# Patient Record
Sex: Male | Born: 1935 | Race: White | Hispanic: No | Marital: Married | Smoking: Never smoker
Health system: Southern US, Community
[De-identification: ages and names within clinical notes are randomized; demographics above are authoritative.]

## PROBLEM LIST (undated history)

## (undated) DIAGNOSIS — I480 Paroxysmal atrial fibrillation: Secondary | ICD-10-CM

## (undated) DIAGNOSIS — I219 Acute myocardial infarction, unspecified: Secondary | ICD-10-CM

## (undated) HISTORY — PX: OTHER SURGICAL HISTORY: SHX169

---
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
## 2006-09-08 ENCOUNTER — Ambulatory Visit: Payer: Self-pay | Admitting: Thoracic Surgery (Cardiothoracic Vascular Surgery)

## 2006-10-25 ENCOUNTER — Ambulatory Visit: Payer: Self-pay

## 2009-07-25 ENCOUNTER — Encounter: Payer: Self-pay | Admitting: Cardiovascular Disease

## 2010-04-28 NOTE — Letter (Signed)
Summary: Letter from [place] from [place] By: Zenovia Jarred 08/20/2009 11:20:56  _____________________________________________________________________  External Attachment:    Type:   Image     Comment:   External Document

## 2010-05-16 ENCOUNTER — Inpatient Hospital Stay (HOSPITAL_COMMUNITY): Payer: Medicare Other

## 2010-05-16 ENCOUNTER — Inpatient Hospital Stay (HOSPITAL_COMMUNITY)
Admission: EM | Admit: 2010-05-16 | Discharge: 2010-05-24 | DRG: 871 | Disposition: A | Discharge status: Home Health | Payer: Medicare Other | Source: Other Acute Inpatient Hospital | Attending: Internal Medicine | Admitting: Internal Medicine

## 2010-05-16 DIAGNOSIS — D72829 Elevated white blood cell count, unspecified: Secondary | ICD-10-CM | POA: Diagnosis present

## 2010-05-16 DIAGNOSIS — I129 Hypertensive chronic kidney disease with stage 1 through stage 4 chronic kidney disease, or unspecified chronic kidney disease: Secondary | ICD-10-CM | POA: Diagnosis present

## 2010-05-16 DIAGNOSIS — R6520 Severe sepsis without septic shock: Secondary | ICD-10-CM | POA: Diagnosis present

## 2010-05-16 DIAGNOSIS — R6521 Severe sepsis with septic shock: Secondary | ICD-10-CM | POA: Diagnosis present

## 2010-05-16 DIAGNOSIS — E872 Acidosis, unspecified: Secondary | ICD-10-CM | POA: Diagnosis present

## 2010-05-16 DIAGNOSIS — R5381 Other malaise: Secondary | ICD-10-CM | POA: Diagnosis present

## 2010-05-16 DIAGNOSIS — G9341 Metabolic encephalopathy: Secondary | ICD-10-CM | POA: Diagnosis present

## 2010-05-16 DIAGNOSIS — N179 Acute kidney failure, unspecified: Secondary | ICD-10-CM | POA: Diagnosis present

## 2010-05-16 DIAGNOSIS — R197 Diarrhea, unspecified: Secondary | ICD-10-CM | POA: Diagnosis present

## 2010-05-16 DIAGNOSIS — A419 Sepsis, unspecified organism: Secondary | ICD-10-CM | POA: Diagnosis present

## 2010-05-16 DIAGNOSIS — J9600 Acute respiratory failure, unspecified whether with hypoxia or hypercapnia: Secondary | ICD-10-CM | POA: Diagnosis present

## 2010-05-16 DIAGNOSIS — E875 Hyperkalemia: Secondary | ICD-10-CM | POA: Diagnosis present

## 2010-05-16 DIAGNOSIS — I4891 Unspecified atrial fibrillation: Secondary | ICD-10-CM | POA: Diagnosis present

## 2010-05-16 DIAGNOSIS — Z781 Physical restraint status: Secondary | ICD-10-CM | POA: Diagnosis present

## 2010-05-16 DIAGNOSIS — I2510 Atherosclerotic heart disease of native coronary artery without angina pectoris: Secondary | ICD-10-CM | POA: Diagnosis present

## 2010-05-16 DIAGNOSIS — D649 Anemia, unspecified: Secondary | ICD-10-CM | POA: Diagnosis present

## 2010-05-16 DIAGNOSIS — J189 Pneumonia, unspecified organism: Secondary | ICD-10-CM | POA: Diagnosis present

## 2010-05-16 DIAGNOSIS — I498 Other specified cardiac arrhythmias: Secondary | ICD-10-CM | POA: Diagnosis present

## 2010-05-16 DIAGNOSIS — Z8673 Personal history of transient ischemic attack (TIA), and cerebral infarction without residual deficits: Secondary | ICD-10-CM

## 2010-05-16 DIAGNOSIS — N189 Chronic kidney disease, unspecified: Secondary | ICD-10-CM | POA: Diagnosis present

## 2010-05-16 DIAGNOSIS — K047 Periapical abscess without sinus: Secondary | ICD-10-CM | POA: Diagnosis present

## 2010-05-16 DIAGNOSIS — Z951 Presence of aortocoronary bypass graft: Secondary | ICD-10-CM

## 2010-05-16 DIAGNOSIS — J9819 Other pulmonary collapse: Secondary | ICD-10-CM | POA: Diagnosis present

## 2010-05-16 LAB — GLUCOSE, CAPILLARY: Glucose-Capillary: 138 mg/dL — ABNORMAL HIGH (ref 70–99)

## 2010-05-16 LAB — PROTIME-INR: Prothrombin Time: 17 seconds — ABNORMAL HIGH (ref 11.6–15.2)

## 2010-05-16 LAB — COMPREHENSIVE METABOLIC PANEL
ALT: 14 U/L (ref 0–53)
AST: 16 U/L (ref 0–37)
Albumin: 2.3 g/dL — ABNORMAL LOW (ref 3.5–5.2)
Alkaline Phosphatase: 47 U/L (ref 39–117)
CO2: 13 mEq/L — ABNORMAL LOW (ref 19–32)
Chloride: 117 mEq/L — ABNORMAL HIGH (ref 96–112)
Creatinine, Ser: 5.79 mg/dL — ABNORMAL HIGH (ref 0.4–1.5)
GFR calc Af Amer: 12 mL/min — ABNORMAL LOW (ref >60)
GFR calc non Af Amer: 10 mL/min — ABNORMAL LOW (ref >60)
Potassium: 5.8 mEq/L — ABNORMAL HIGH (ref 3.5–5.1)
Sodium: 140 mEq/L (ref 135–145)
Total Bilirubin: 0.3 mg/dL (ref 0.3–1.2)

## 2010-05-16 LAB — URINE MICROSCOPIC-ADD ON

## 2010-05-16 LAB — CBC
MCH: 30.4 pg (ref 26.0–34.0)
MCHC: 31.5 g/dL (ref 30.0–36.0)
MCV: 96.6 fL (ref 78.0–100.0)
Platelets: 269 10*3/uL (ref 150–400)
RBC: 3.49 MIL/uL — ABNORMAL LOW (ref 4.22–5.81)

## 2010-05-16 LAB — CARBOXYHEMOGLOBIN
Methemoglobin: 0.7 % (ref 0.0–1.5)
O2 Saturation: 63 %
Total hemoglobin: 11.3 g/dL — ABNORMAL LOW (ref 13.5–18.0)

## 2010-05-16 LAB — URINALYSIS, ROUTINE W REFLEX MICROSCOPIC
Ketones, ur: NEGATIVE mg/dL
Leukocytes, UA: NEGATIVE
Nitrite: NEGATIVE
Specific Gravity, Urine: 1.019 (ref 1.005–1.030)
Urine Glucose, Fasting: NEGATIVE mg/dL
pH: 5 (ref 5.0–8.0)

## 2010-05-16 LAB — POCT I-STAT 3, ART BLOOD GAS (G3+)
Acid-base deficit: 15 mmol/L — ABNORMAL HIGH (ref 0.0–2.0)
O2 Saturation: 95 %
TCO2: 13 mmol/L (ref 0–100)
pO2, Arterial: 94 mmHg (ref 80.0–100.0)

## 2010-05-16 LAB — BRAIN NATRIURETIC PEPTIDE: Pro B Natriuretic peptide (BNP): 104 pg/mL — ABNORMAL HIGH (ref 0.0–100.0)

## 2010-05-16 LAB — LACTIC ACID, PLASMA: Lactic Acid, Venous: 1 mmol/L (ref 0.5–2.2)

## 2010-05-16 LAB — AMYLASE: Amylase: 55 U/L (ref 0–105)

## 2010-05-17 ENCOUNTER — Inpatient Hospital Stay (HOSPITAL_COMMUNITY): Payer: Medicare Other

## 2010-05-17 ENCOUNTER — Other Ambulatory Visit (HOSPITAL_COMMUNITY): Payer: Medicare Other

## 2010-05-17 DIAGNOSIS — R579 Shock, unspecified: Secondary | ICD-10-CM

## 2010-05-17 DIAGNOSIS — J9600 Acute respiratory failure, unspecified whether with hypoxia or hypercapnia: Secondary | ICD-10-CM

## 2010-05-17 DIAGNOSIS — E782 Mixed hyperlipidemia: Secondary | ICD-10-CM

## 2010-05-17 DIAGNOSIS — N179 Acute kidney failure, unspecified: Secondary | ICD-10-CM

## 2010-05-17 DIAGNOSIS — R0602 Shortness of breath: Secondary | ICD-10-CM

## 2010-05-17 LAB — BLOOD GAS, ARTERIAL
Acid-base deficit: 12.5 mmol/L — ABNORMAL HIGH (ref 0.0–2.0)
Acid-base deficit: 11.7 mmol/L — ABNORMAL HIGH (ref 0.0–2.0)
Bicarbonate: 14.5 meq/L — ABNORMAL LOW (ref 20.0–24.0)
Drawn by: 23604
Drawn by: 23604
FIO2: 0.5 %
MECHVT: 500 mL
O2 Content: 5 L/min
O2 Saturation: 96.5 %
PEEP: 5 cmH2O
Patient temperature: 98.6
Patient temperature: 98.6
RATE: 26 {breaths}/min
TCO2: 16.6 mmol/L (ref 0–100)
TCO2: 15.7 mmol/L (ref 0–100)
pCO2 arterial: 36.7 mmHg (ref 35.0–45.0)
pH, Arterial: 7.222 — ABNORMAL LOW (ref 7.350–7.450)
pO2, Arterial: 88.7 mmHg (ref 80.0–100.0)

## 2010-05-17 LAB — BASIC METABOLIC PANEL
CO2: 15 mEq/L — ABNORMAL LOW (ref 19–32)
Chloride: 113 mEq/L — ABNORMAL HIGH (ref 96–112)
GFR calc Af Amer: 13 mL/min — ABNORMAL LOW (ref >60)
Potassium: 5.1 mEq/L (ref 3.5–5.1)

## 2010-05-17 LAB — EXPECTORATED SPUTUM ASSESSMENT W GRAM STAIN, RFLX TO RESP C

## 2010-05-17 LAB — HEPARIN LEVEL (UNFRACTIONATED)
Heparin Unfractionated: 0.2 IU/mL — ABNORMAL LOW (ref 0.30–0.70)
Heparin Unfractionated: 0.34 IU/mL (ref 0.30–0.70)

## 2010-05-17 LAB — DIFFERENTIAL
Basophils Absolute: 0 10*3/uL (ref 0.0–0.1)
Basophils Absolute: 0 K/uL (ref 0.0–0.1)
Basophils Relative: 0 % (ref 0–1)
Basophils Relative: 0 % (ref 0–1)
Eosinophils Absolute: 0 10*3/uL (ref 0.0–0.7)
Eosinophils Absolute: 0 K/uL (ref 0.0–0.7)
Eosinophils Relative: 0 % (ref 0–5)
Lymphocytes Relative: 12 % (ref 12–46)
Lymphocytes Relative: 8 % — ABNORMAL LOW (ref 12–46)
Lymphs Abs: 1 10*3/uL (ref 0.7–4.0)
Lymphs Abs: 0.6 K/uL — ABNORMAL LOW (ref 0.7–4.0)
Monocytes Absolute: 0.9 K/uL (ref 0.1–1.0)
Monocytes Relative: 10 % (ref 3–12)
Neutro Abs: 6.1 10*3/uL (ref 1.7–7.7)
Neutro Abs: 6.8 K/uL (ref 1.7–7.7)
Neutrophils Relative %: 82 % — ABNORMAL HIGH (ref 43–77)

## 2010-05-17 LAB — POCT I-STAT 3, ART BLOOD GAS (G3+)
Bicarbonate: 16.5 mEq/L — ABNORMAL LOW (ref 20.0–24.0)
Patient temperature: 100
pH, Arterial: 7.448 (ref 7.350–7.450)

## 2010-05-17 LAB — CBC
HCT: 32.4 % — ABNORMAL LOW (ref 39.0–52.0)
Hemoglobin: 10.2 g/dL — ABNORMAL LOW (ref 13.0–17.0)
MCH: 30.6 pg (ref 26.0–34.0)
MCHC: 31.5 g/dL (ref 30.0–36.0)
MCV: 97.3 fL (ref 78.0–100.0)
Platelets: 283 K/uL (ref 150–400)
RBC: 3.33 MIL/uL — ABNORMAL LOW (ref 4.22–5.81)
RDW: 14 % (ref 11.5–15.5)
WBC: 8.3 K/uL (ref 4.0–10.5)

## 2010-05-17 LAB — GLUCOSE, CAPILLARY
Glucose-Capillary: 173 mg/dL — ABNORMAL HIGH (ref 70–99)
Glucose-Capillary: 159 mg/dL — ABNORMAL HIGH (ref 70–99)
Glucose-Capillary: 175 mg/dL — ABNORMAL HIGH (ref 70–99)

## 2010-05-17 LAB — BASIC METABOLIC PANEL WITH GFR
BUN: 91 mg/dL — ABNORMAL HIGH (ref 6–23)
CO2: 19 meq/L (ref 19–32)
Calcium: 7.5 mg/dL — ABNORMAL LOW (ref 8.4–10.5)
Chloride: 115 meq/L — ABNORMAL HIGH (ref 96–112)
Creatinine, Ser: 3.98 mg/dL — ABNORMAL HIGH (ref 0.4–1.5)
GFR calc non Af Amer: 15 mL/min — ABNORMAL LOW
Glucose, Bld: 174 mg/dL — ABNORMAL HIGH (ref 70–99)
Potassium: 3.8 meq/L (ref 3.5–5.1)
Sodium: 143 meq/L (ref 135–145)

## 2010-05-17 LAB — CLOSTRIDIUM DIFFICILE BY PCR: Toxigenic C. Difficile by PCR: NEGATIVE

## 2010-05-17 LAB — CARDIAC PANEL(CRET KIN+CKTOT+MB+TROPI)
CK, MB: 6.9 ng/mL (ref 0.3–4.0)
CK, MB: 4.6 ng/mL — ABNORMAL HIGH (ref 0.3–4.0)
Relative Index: 4.2 — ABNORMAL HIGH (ref 0.0–2.5)
Relative Index: 4.3 — ABNORMAL HIGH (ref 0.0–2.5)
Relative Index: 3.6 — ABNORMAL HIGH (ref 0.0–2.5)
Total CK: 156 U/L (ref 7–232)
Total CK: 159 U/L (ref 7–232)
Total CK: 128 U/L (ref 7–232)
Troponin I: 0.02 ng/mL (ref 0.00–0.06)

## 2010-05-17 LAB — MAGNESIUM: Magnesium: 2 mg/dL (ref 1.5–2.5)

## 2010-05-17 LAB — HEPATIC FUNCTION PANEL
AST: 16 U/L (ref 0–37)
Bilirubin, Direct: <0.1 mg/dL (ref 0.0–0.3)

## 2010-05-17 LAB — STREP PNEUMONIAE URINARY ANTIGEN: Strep Pneumo Urinary Antigen: NEGATIVE

## 2010-05-17 LAB — TSH: TSH: 0.201 u[IU]/mL — ABNORMAL LOW (ref 0.350–4.500)

## 2010-05-17 LAB — PHOSPHORUS: Phosphorus: 3.3 mg/dL (ref 2.3–4.6)

## 2010-05-17 LAB — TYPE AND SCREEN: ABO/RH(D): A POS

## 2010-05-17 LAB — PROCALCITONIN: Procalcitonin: 0.58 ng/mL

## 2010-05-17 LAB — INFLUENZA PANEL BY PCR (TYPE A & B)
H1N1 flu by pcr: NOT DETECTED
Influenza A By PCR: NEGATIVE
Influenza B By PCR: NEGATIVE

## 2010-05-17 LAB — LACTIC ACID, PLASMA: Lactic Acid, Venous: 1.5 mmol/L (ref 0.5–2.2)

## 2010-05-17 LAB — ABO/RH: ABO/RH(D): A POS

## 2010-05-18 ENCOUNTER — Inpatient Hospital Stay (HOSPITAL_COMMUNITY): Payer: Medicare Other

## 2010-05-18 LAB — URINE CULTURE
Colony Count: NO GROWTH
Colony Count: NO GROWTH
Culture  Setup Time: 201202202329
Culture: NO GROWTH
Culture: NO GROWTH

## 2010-05-18 LAB — POCT I-STAT 3, ART BLOOD GAS (G3+)
Acid-Base Excess: 3 mmol/L — ABNORMAL HIGH (ref 0.0–2.0)
O2 Saturation: 97 %
Patient temperature: 99.9
TCO2: 26 mmol/L (ref 0–100)
pCO2 arterial: 34.8 mmHg — ABNORMAL LOW (ref 35.0–45.0)
pH, Arterial: 7.578 — ABNORMAL HIGH (ref 7.350–7.450)
pH, Arterial: 7.473 — ABNORMAL HIGH (ref 7.350–7.450)
pO2, Arterial: 81 mmHg (ref 80.0–100.0)

## 2010-05-18 LAB — LEGIONELLA ANTIGEN, URINE: Legionella Antigen, Urine: NEGATIVE

## 2010-05-18 LAB — BASIC METABOLIC PANEL
Calcium: 7.5 mg/dL — ABNORMAL LOW (ref 8.4–10.5)
Creatinine, Ser: 3.23 mg/dL — ABNORMAL HIGH (ref 0.4–1.5)
GFR calc Af Amer: 23 mL/min — ABNORMAL LOW (ref >60)
GFR calc non Af Amer: 19 mL/min — ABNORMAL LOW (ref >60)
Sodium: 142 mEq/L (ref 135–145)

## 2010-05-18 LAB — CBC
MCV: 92.5 fL (ref 78.0–100.0)
Platelets: 260 10*3/uL (ref 150–400)
RBC: 3.06 MIL/uL — ABNORMAL LOW (ref 4.22–5.81)
RDW: 13.3 % (ref 11.5–15.5)
WBC: 10.4 10*3/uL (ref 4.0–10.5)

## 2010-05-18 LAB — HEPARIN LEVEL (UNFRACTIONATED): Heparin Unfractionated: 0.39 IU/mL (ref 0.30–0.70)

## 2010-05-18 LAB — GLUCOSE, CAPILLARY
Glucose-Capillary: 215 mg/dL — ABNORMAL HIGH (ref 70–99)
Glucose-Capillary: 217 mg/dL — ABNORMAL HIGH (ref 70–99)

## 2010-05-18 LAB — PHOSPHORUS: Phosphorus: 2 mg/dL — ABNORMAL LOW (ref 2.3–4.6)

## 2010-05-18 LAB — MAGNESIUM: Magnesium: 1.8 mg/dL (ref 1.5–2.5)

## 2010-05-19 DIAGNOSIS — I4891 Unspecified atrial fibrillation: Secondary | ICD-10-CM

## 2010-05-19 LAB — BASIC METABOLIC PANEL
CO2: 29 mEq/L (ref 19–32)
Chloride: 106 mEq/L (ref 96–112)
Creatinine, Ser: 2.94 mg/dL — ABNORMAL HIGH (ref 0.4–1.5)
GFR calc Af Amer: 25 mL/min — ABNORMAL LOW (ref >60)
Glucose, Bld: 144 mg/dL — ABNORMAL HIGH (ref 70–99)

## 2010-05-19 LAB — CBC
HCT: 27.1 % — ABNORMAL LOW (ref 39.0–52.0)
Hemoglobin: 8.7 g/dL — ABNORMAL LOW (ref 13.0–17.0)
MCH: 30.7 pg (ref 26.0–34.0)
MCHC: 32.1 g/dL (ref 30.0–36.0)
MCV: 95.8 fL (ref 78.0–100.0)
RBC: 2.83 MIL/uL — ABNORMAL LOW (ref 4.22–5.81)

## 2010-05-19 LAB — CULTURE, RESPIRATORY W GRAM STAIN: Culture: NORMAL

## 2010-05-19 LAB — GLUCOSE, CAPILLARY
Glucose-Capillary: 113 mg/dL — ABNORMAL HIGH (ref 70–99)
Glucose-Capillary: 114 mg/dL — ABNORMAL HIGH (ref 70–99)
Glucose-Capillary: 119 mg/dL — ABNORMAL HIGH (ref 70–99)
Glucose-Capillary: 119 mg/dL — ABNORMAL HIGH (ref 70–99)
Glucose-Capillary: 154 mg/dL — ABNORMAL HIGH (ref 70–99)
Glucose-Capillary: 217 mg/dL — ABNORMAL HIGH (ref 70–99)

## 2010-05-19 LAB — HEPARIN LEVEL (UNFRACTIONATED): Heparin Unfractionated: 0.29 IU/mL — ABNORMAL LOW (ref 0.30–0.70)

## 2010-05-19 LAB — HEMOGLOBIN A1C: Hgb A1c MFr Bld: 5.9 % — ABNORMAL HIGH (ref <5.7)

## 2010-05-20 ENCOUNTER — Inpatient Hospital Stay (HOSPITAL_COMMUNITY): Payer: Medicare Other

## 2010-05-20 LAB — GLUCOSE, CAPILLARY
Glucose-Capillary: 126 mg/dL — ABNORMAL HIGH (ref 70–99)
Glucose-Capillary: 137 mg/dL — ABNORMAL HIGH (ref 70–99)

## 2010-05-20 LAB — CULTURE, BAL-QUANTITATIVE W GRAM STAIN

## 2010-05-20 LAB — CBC
MCH: 30.8 pg (ref 26.0–34.0)
MCHC: 31.6 g/dL (ref 30.0–36.0)
MCV: 97.6 fL (ref 78.0–100.0)
Platelets: 291 10*3/uL (ref 150–400)
RBC: 2.95 MIL/uL — ABNORMAL LOW (ref 4.22–5.81)
RDW: 13.7 % (ref 11.5–15.5)

## 2010-05-20 LAB — BASIC METABOLIC PANEL
BUN: 51 mg/dL — ABNORMAL HIGH (ref 6–23)
Calcium: 8.2 mg/dL — ABNORMAL LOW (ref 8.4–10.5)
GFR calc non Af Amer: 24 mL/min — ABNORMAL LOW (ref >60)
Glucose, Bld: 111 mg/dL — ABNORMAL HIGH (ref 70–99)
Potassium: 3.9 mEq/L (ref 3.5–5.1)

## 2010-05-20 LAB — NOROVIRUS GROUP 1 & 2 BY PCR, STOOL

## 2010-05-20 LAB — MAGNESIUM: Magnesium: 2 mg/dL (ref 1.5–2.5)

## 2010-05-20 LAB — PULMONARY FUNCTION TEST

## 2010-05-21 LAB — GLUCOSE, CAPILLARY
Glucose-Capillary: 107 mg/dL — ABNORMAL HIGH (ref 70–99)
Glucose-Capillary: 89 mg/dL (ref 70–99)

## 2010-05-21 LAB — COMPREHENSIVE METABOLIC PANEL
ALT: 13 U/L (ref 0–53)
Alkaline Phosphatase: 42 U/L (ref 39–117)
BUN: 47 mg/dL — ABNORMAL HIGH (ref 6–23)
CO2: 28 mEq/L (ref 19–32)
Calcium: 8.1 mg/dL — ABNORMAL LOW (ref 8.4–10.5)
GFR calc non Af Amer: 25 mL/min — ABNORMAL LOW (ref >60)
Glucose, Bld: 100 mg/dL — ABNORMAL HIGH (ref 70–99)
Sodium: 142 mEq/L (ref 135–145)
Total Protein: 5.9 g/dL — ABNORMAL LOW (ref 6.0–8.3)

## 2010-05-21 LAB — STOOL CULTURE

## 2010-05-21 LAB — PHOSPHORUS: Phosphorus: 3.4 mg/dL (ref 2.3–4.6)

## 2010-05-21 LAB — HEPARIN LEVEL (UNFRACTIONATED): Heparin Unfractionated: 0.41 IU/mL (ref 0.30–0.70)

## 2010-05-21 LAB — MAGNESIUM: Magnesium: 1.8 mg/dL (ref 1.5–2.5)

## 2010-05-21 LAB — CBC
MCH: 30.6 pg (ref 26.0–34.0)
MCHC: 31.3 g/dL (ref 30.0–36.0)
RDW: 13.2 % (ref 11.5–15.5)

## 2010-05-21 LAB — PROTIME-INR: Prothrombin Time: 13.9 seconds (ref 11.6–15.2)

## 2010-05-22 LAB — CBC
HCT: 29.5 % — ABNORMAL LOW (ref 39.0–52.0)
Hemoglobin: 9.2 g/dL — ABNORMAL LOW (ref 13.0–17.0)
RDW: 13 % (ref 11.5–15.5)
WBC: 17.3 10*3/uL — ABNORMAL HIGH (ref 4.0–10.5)

## 2010-05-22 LAB — PROTIME-INR
INR: 1.12 (ref 0.00–1.49)
Prothrombin Time: 14.6 seconds (ref 11.6–15.2)

## 2010-05-22 LAB — DIFFERENTIAL
Basophils Absolute: 0 K/uL (ref 0.0–0.1)
Basophils Relative: 0 % (ref 0–1)
Eosinophils Absolute: 0 K/uL (ref 0.0–0.7)
Eosinophils Relative: 0 % (ref 0–5)
Lymphocytes Relative: 9 % — ABNORMAL LOW (ref 12–46)
Lymphs Abs: 1.6 K/uL (ref 0.7–4.0)
Monocytes Absolute: 1 K/uL (ref 0.1–1.0)
Monocytes Relative: 6 % (ref 3–12)
Neutro Abs: 14.7 K/uL — ABNORMAL HIGH (ref 1.7–7.7)
Neutrophils Relative %: 85 % — ABNORMAL HIGH (ref 43–77)

## 2010-05-22 LAB — COMPREHENSIVE METABOLIC PANEL
ALT: 27 U/L (ref 0–53)
Calcium: 8.1 mg/dL — ABNORMAL LOW (ref 8.4–10.5)
Creatinine, Ser: 2.31 mg/dL — ABNORMAL HIGH (ref 0.4–1.5)
GFR calc non Af Amer: 28 mL/min — ABNORMAL LOW (ref >60)
Glucose, Bld: 100 mg/dL — ABNORMAL HIGH (ref 70–99)
Sodium: 140 mEq/L (ref 135–145)
Total Protein: 6.1 g/dL (ref 6.0–8.3)

## 2010-05-22 LAB — MAGNESIUM: Magnesium: 1.6 mg/dL (ref 1.5–2.5)

## 2010-05-22 LAB — GLUCOSE, CAPILLARY
Glucose-Capillary: 95 mg/dL (ref 70–99)
Glucose-Capillary: 91 mg/dL (ref 70–99)
Glucose-Capillary: 90 mg/dL (ref 70–99)

## 2010-05-22 LAB — HEPARIN LEVEL (UNFRACTIONATED): Heparin Unfractionated: 0.37 IU/mL (ref 0.30–0.70)

## 2010-05-22 NOTE — Discharge Summary (Signed)
  NAME:  Robert Chapman, Robert Chapman NO.:  [id number]  MEDICAL RECORD NO.:  [id number]           PATIENT TYPE:  I  LOCATION:  2012                         FACILITY:  [facility]  PHYSICIAN:  Jackie Plum, MD  DATE OF BIRTH:  Oct 30, 1933  DATE OF ADMISSION:  05/16/2010 DATE OF DISCHARGE:                        PROGRESS NOTE   CONSULTANTS: 1. PCCM. 2. Cardiology.  WORKING DIAGNOSES: 1. Vent-dependent respiratory failure, status post extubation. 2. Sepsis. 3. Metabolic acidosis/encephalopathy. 4. Acute and chronic kidney disease. 5. Atrial fibrillation. 6. Anemia. 7. Hypertension. 8. Coronary artery disease status post CABG. 9. Dyslipidemia. 10.Leukocytosis, questionable secondary to steroid use.  The patient is a 77 year old Caucasian male who was admitted to the hospital on May 16, 2010 with 3 days history of the diarrhea, progressive dyspnea, and weakness.  The patient was also said to have going into shock renal failure with anion gap acidosis in addition he was also found to have atrial  fibrillation with RPR.  He was subsequently intubated and mechanically ventilated by PCCM.  When the patient was under the services of PCCM he was given IV Zosyn and Zithromax as well as Flagyl, he was also given Tamiflu.  He was stabilized and subsequently extubated and transferred to the services of the [facility].  The patient was seen by me for the very first time today which is May 18, 2010.  He denied any chest pain or shortness of breath.  Examination of the patient showed decreased air entry bibasilar.  Other than that he is clinically stable.  His vital signs blood pressure is 113/87, pulse 87, respiratory rate 20, temperature is 98.4.  AVAILABLE LABS:  Phosphorus level 3.4, magnesium level is 1.8. Comprehensive metabolic panel showed sodium of 142, potassium of 3.3, chloride 106, bicarb of 28, glucose is 100, BUN is 47, creatinine is 2.57.  Complete  blood count with differential showed WBC of 16.1, hemoglobin of 9.0, hematocrit of 28.8, MCV 98.0, platelet count of 279.  PLAN:  The overall plan is to continue anticoagulation with IV heparin and will monitor the heparin level as well as PTT and INR level and Coumadin will be introduced in 2 days time.  The patient will be followed and evaluated on daily basis.     Jackie Plum, MD     CN/MEDQ  D:  05/21/2010  T:  05/21/2010  Job:  [id number]  Electronically Signed by Jackie Plum  on 05/22/2010 06:36:01 AM

## 2010-05-23 LAB — DIFFERENTIAL
Basophils Absolute: 0 10*3/uL (ref 0.0–0.1)
Basophils Relative: 0 % (ref 0–1)
Eosinophils Absolute: 0.1 10*3/uL (ref 0.0–0.7)
Lymphocytes Relative: 8 % — ABNORMAL LOW (ref 12–46)
Lymphs Abs: 1.2 10*3/uL (ref 0.7–4.0)
Monocytes Absolute: 1 10*3/uL (ref 0.1–1.0)
Neutro Abs: 12.3 10*3/uL — ABNORMAL HIGH (ref 1.7–7.7)

## 2010-05-23 LAB — CULTURE, BLOOD (ROUTINE X 2)
Culture  Setup Time: 201202200857
Culture: NO GROWTH

## 2010-05-23 LAB — CBC
MCH: 30.5 pg (ref 26.0–34.0)
MCHC: 31.3 g/dL (ref 30.0–36.0)
MCV: 97.4 fL (ref 78.0–100.0)
Platelets: 252 10*3/uL (ref 150–400)
RDW: 13.2 % (ref 11.5–15.5)

## 2010-05-23 LAB — COMPREHENSIVE METABOLIC PANEL
AST: 24 U/L (ref 0–37)
CO2: 27 mEq/L (ref 19–32)
Calcium: 8.1 mg/dL — ABNORMAL LOW (ref 8.4–10.5)
Chloride: 101 mEq/L (ref 96–112)
Creatinine, Ser: 2.22 mg/dL — ABNORMAL HIGH (ref 0.4–1.5)
GFR calc non Af Amer: 29 mL/min — ABNORMAL LOW (ref >60)
Glucose, Bld: 87 mg/dL (ref 70–99)
Total Bilirubin: 0.6 mg/dL (ref 0.3–1.2)

## 2010-05-23 LAB — MAGNESIUM: Magnesium: 1.5 mg/dL (ref 1.5–2.5)

## 2010-05-23 LAB — GLUCOSE, CAPILLARY
Glucose-Capillary: 80 mg/dL (ref 70–99)
Glucose-Capillary: 85 mg/dL (ref 70–99)

## 2010-05-23 LAB — PROTIME-INR: Prothrombin Time: 16.2 seconds — ABNORMAL HIGH (ref 11.6–15.2)

## 2010-05-24 LAB — GLUCOSE, CAPILLARY
Glucose-Capillary: 99 mg/dL (ref 70–99)
Glucose-Capillary: 75 mg/dL (ref 70–99)

## 2010-05-24 LAB — COMPREHENSIVE METABOLIC PANEL
BUN: 24 mg/dL — ABNORMAL HIGH (ref 6–23)
CO2: 31 mEq/L (ref 19–32)
Chloride: 101 mEq/L (ref 96–112)
Creatinine, Ser: 2.11 mg/dL — ABNORMAL HIGH (ref 0.4–1.5)
GFR calc non Af Amer: 31 mL/min — ABNORMAL LOW (ref >60)
Glucose, Bld: 90 mg/dL (ref 70–99)
Total Bilirubin: 0.4 mg/dL (ref 0.3–1.2)

## 2010-05-24 LAB — CBC
MCH: 30.8 pg (ref 26.0–34.0)
MCHC: 31.6 g/dL (ref 30.0–36.0)
MCV: 97.6 fL (ref 78.0–100.0)
Platelets: 266 10*3/uL (ref 150–400)
RDW: 13.2 % (ref 11.5–15.5)
WBC: 11.9 10*3/uL — ABNORMAL HIGH (ref 4.0–10.5)

## 2010-05-24 LAB — DIFFERENTIAL
Eosinophils Absolute: 0.2 10*3/uL (ref 0.0–0.7)
Eosinophils Relative: 1 % (ref 0–5)
Lymphs Abs: 1 10*3/uL (ref 0.7–4.0)
Monocytes Relative: 8 % (ref 3–12)

## 2010-05-25 ENCOUNTER — Ambulatory Visit: Payer: Medicare Other

## 2010-05-26 NOTE — Consult Note (Addendum)
NAME:  Robert Chapman, Robert Chapman NO.:  [id number]  MEDICAL RECORD NO.:  [id number]           PATIENT TYPE:  I  LOCATION:  2012                         FACILITY:  [facility]  PHYSICIAN:  Peter M. Chapman, M.D.  DATE OF BIRTH:  [birth year]  DATE OF CONSULTATION:  05/19/2010 DATE OF DISCHARGE:                                CONSULTATION   PRIMARY CARDIOLOGIST:  The patient does not have one.  PRIMARY MEDICAL DOCTOR:  Robert Penna. Linna Darner, MD, FACP, FCCP  CHIEF COMPLAINT:  Weakness.  REASON FOR CONSULTATION:  Atrial fibrillation.  HISTORY OF PRESENT ILLNESS:  Robert Chapman is a 77 year old gentleman with past medical history including CAD, status post CABG, hypertension, hyperlipidemia, and TIA, who presented with weakness and altered mental status.  Per report, he also approximately a week with a progressive cough and progressive diarrhea for 3 days.  I advised took him to the [place] ER when he realized how weak he was.  He was found to have acute renal failure with creatinine of 7, potassium over 7, metabolic acidosis and hypertension as well as new onset AFib with RVR.  He was started on heparin, Cardizem as well as pressors for pressure support. There was concern for PE with a D-dimer of 6.06.  Lower extremities Dopplers have been negative.  No imaging has been performed for PE.  On May 17, 2010, he was intubated for metabolic issues and extubated today and doing much better.  He initially received antibiotics for community-acquired pneumonia and also got empiric Flagyl and Tamiflu. Azithromycin dose being discontinued today and he is being continued on Avelox.  His creatinine is improving and the initial acute insufficiency was felt secondary to dehydration.  We are called to follow along for AFib.  The patient is currently on heparin with a heart rate of 90s to 120s.  He is not in the rate control as he required pressor support up until yesterday.  He denies any chest  pain, shortness of breath, palpitations, or dizziness.  His current state today; he is anemic with a hemoglobin of 8.7.  It also appears that he may be diabetic as well as blood sugars have been elevated here in the hospital.  He is also hypothyroid and is taking levothyroxine at home which has not been continued here.  TSH is abnormal on admission.  PAST MEDICAL HISTORY: 1. CAD, status post CABG.  The patient thinks he may have had this at     [facility] with Robert Chapman, but is not totally sure.  He did it     about 6 years ago.  We will ask for records. 2. Hypertension. 3. Hyperlipidemia. 4. Hypothyroidism. 5. TIA, years ago, on Aggrenox.  OUTPATIENT MEDICATIONS: 1. Lisinopril 40 mg daily. 2. Fish oil 1000 mg b.i.d. 3. Niacin 500 mg daily. 4. Atenolol 25 mg daily. 5. Amlodipine 10 mg daily. 6. Zocor 20 mg nightly. 7. Spironolactone/hydrochlorothiazide 25/25 mg daily. 8. Vitamin. 9. Aspirin 81 mg daily. 10.Levothyroxine 125 mcg daily. 11.Aggrenox 25/200 mg b.i.d.  INPATIENT MEDICATIONS: 1. Albuterol inhaled q.6 h. 2. Zithromax 350 mg IV q.24 h. 3. Heparin drip.  4. Solu-Cortef 60 mg IV q.6 h. 5. Sliding scale insulin. 6. Protonix 40 mg daily. 7. Zosyn 2.25 g IV q. 6 h. 8. Potassium chloride 40 mEq.  ALLERGIES:  No known drug allergies.  SOCIAL HISTORY:  Robert Chapman is married.  He has two children.  He denies any tobacco or alcohol use.  FAMILY HISTORY:  His mother passed away at 40 of CHF and father died at 38 of cancer.  REVIEW OF SYSTEMS:  No current fevers, chills, chest pain, shortness of breath, dyspnea on exertion, edema, palpitations, nausea, vomiting, diarrhea.  All other systems were reviewed and otherwise negative except for those noted in the HPI.  LABORATORY DATA:  WBC 12, hemoglobin 8.7, hematocrit 27.1, platelet count 261.  Sodium 146, potassium 3.4, chloride 106, CO2 29, glucose 144, BUN 59, creatinine 2.94.  TSH 0.201.  D-dimer 6.06.   Amylase and lipase normal.  BNP is 104.  Procalcitonin 0.39, lactic acid 1.5. Cardiac enzymes have shown negative CKs and troponins are mildly elevated, MB 6.9.  Thick blood in urine.  Bronchoalveolar lavage and stool cultures have also negative as well as negative C. diff and flu testing.  RADIOLOGY: 1. Chest x-ray showed stable support apparatus with stable asymmetric     left hemidiaphragm elevation with overlapping atelectasis. 2. Doppler, lower extremities showed negative DVT.  PHYSICAL EXAMINATION:  VITAL SIGNS:  Temperature 97.7, pulse 88, respirations 15, blood pressure 122/61, pulse ox 98% on 2 liters. GENERAL:  This is a very pleasant white male in no acute distress, sitting up in bed. HEENT:  Normocephalic, atraumatic with extraocular movements intact. Sclerae clear.  Nares are without discharge. NECK:  Supple without obvious lymphadenopathy. HEART:  Auscultation reveals irregularly irregular rhythm, slightly tachycardic without murmurs, rubs or gallops. LUNGS:  Have decreased breath sounds of the bases.  Breathing is unlabored. ABDOMEN:  Soft, nontender, and nondistended with positive bowel sounds. It is round.  No rebound or guarding. EXTREMITIES:  Warm and dry without edema. NEUROLOGIC:  He is alert and oriented x3.  He responds questions appropriately with a normal affect.  ASSESSMENT AND PLAN:  The patient was seen and examined by Robert Chapman and myself.  This is a 77 year old gentleman new to [facility] with a history of coronary artery bypass graft in 2007, hypertension, hyperlipidemia, and transient ischemic attack, who was admitted with acute renal failure, felt secondary dehydration in the setting of diarrhea and coughing.  There has also been concerned for pulmonary embolism with an elevating D-dimer 6.06, for which, he is being anticoagulated.  He also developed new-onset atrial fibrillation, which the patient denies any history of such.  We are in  the process of requesting the records to be stent from Dr. Everrett Coombe office for his surgical history.  The patient has been lost a follow up from a cardiac standpoint.  His renal failure is improving and cultures including blood in urine and stool cultures have been negative thus far.  Now plan is as follows. 1. Atrial fibrillation with rapid ventricular response, which is new.     The patient is maintained on IV heparin, which we agreed less for     now.  We will await his 2-D echocardiogram to fully assess his     CHADS score, but he likely should be started on Coumadin given the     fact that he has known hypertension, history of transient ischemic     attack and possible diabetes.  His pressures are improving today  and therefore, we will initiate low-dose beta blockade therapy for     not only rate control, but his coronary artery disease as well with     Lopressor 12.5 mg p.o. b.i.d.  We will hold off on initiating     Coumadin today because we want to follow his CBC to ensure that he     is not becoming increasingly anemic. 2. Coronary artery disease, status post coronary artery bypass graft.     The patient is not having any anginal symptoms and EKG demonstrates     atrial fibrillation with no acute changes.  As above, we will     initiate beta blockade therapy, and resume his statin.  We will     hold off on aspirin until tomorrow pending stability of his     hemoglobin. 3. Questionable new-onset diabetes mellitus.  The patient denies any     history of diabetes, but has been on the hyperglycemia protocol     here in the ICU.  We will check hemoglobin A1c and defer initiation     medications with primary team and further referral. 4. Hypothyroidism.  The patient is on levothyroxine 125 mcg at home,     but this has not been continued here in the hospital.  His TSH was     abnormal on admission.  For clarity, we will repeat a TSH and free     C4 and recommend to resume home  medication based on those pending     results. 5. History of transient ischemic attack.  As above, we will await     stability of tomorrow's CBC to initiate aspirin.  We would hold off     on Aggrenox given his heparin and probable initiation of Coumadin.  Thank you for the opportunity to participate in the care of this patient.  We will continue to follow with you.     Melina Copa, P.A.C.   ______________________________ Peter M. Chapman, M.D.    DD/MEDQ  D:  05/19/2010  T:  05/20/2010  Job:  [id number]  cc:   Robert Penna. Linna Darner, MD,FACP,FCCP  Electronically Signed by PETER Chapman M.D. on 05/26/2010 04:52:13 PM Electronically Signed by Melina Copa  on 05/31/2010 05:11:43 PM

## 2010-05-31 NOTE — H&P (Signed)
NAME:  Robert Chapman, Robert Chapman NO.:  [id number]  MEDICAL RECORD NO.:  [id number]           PATIENT TYPE:  I  LOCATION:  [id number]                         FACILITY:  [facility]  PHYSICIAN:  Jana Hakim, M.D. DATE OF BIRTH:  [birth year]  DATE OF ADMISSION:  05/16/2010 DATE OF DISCHARGE:                             HISTORY & PHYSICAL   PRIMARY CARE PHYSICIAN:  Unassigned.  The patient is from [facility].  CHIEF COMPLAINT:  This is a 77 year old male who presents who presented to the [facility] Emergency Department secondary to complaints of shortness of breath, malaise and poor appetite over the past 3 days.  The patient was evaluated in the emergency department there and found to have acute renal failure with a BUN of 123 and a creatinine of 7.23 and a potassium level initially 7.0.  Arrangements were made to transfer the patient to the [facility] for further treatment secondary to the acute renal failure and hyperkalemia.  On arrival, when the patient was seen, the patient was hypotensive and severely tachycardic.  He had per the medical records initially had been in atrial fibrillation with RVR.  The patient had been started on treatment for his hyperkalemia.  His repeat potassium level was found to be 5.6.  At the time of evaluation of the patient, the patient was somnolent and confused.  He was hypotensive and tachycardic and his rhythm was in sinus tachycardia at that time.  The patient was placed on IV fluids and his blood pressure and heart rate were responding to fluid resuscitation.  The patient had notable chest congestion and cough.  The patient was placed on respiratory precautions for possible pneumonia and repeat laboratory studies were performed along with a portable chest x- ray to evaluate for possible pneumonia.  PAST MEDICAL HISTORY: 1. Coronary artery disease, status post coronary artery bypass     grafting. 2.  Hypertension. 3. Dyslipidemia.  MEDICATIONS:  Lisinopril, amlodipine, atenolol, spirolactone/HCT, Aggrenox, Bayer Aspirin, simvastatin, levothyroxine, multivitamin, fish oil, and niacin.  ALLERGIES:  No known drug allergies.  SOCIAL HISTORY:  The patient denies smoking, alcohol, or illicit drug usage.  FAMILY HISTORY:  Unknown.  REVIEW OF SYSTEMS:  Pertinent as mentioned above.  PHYSICAL EXAMINATION FINDINGS:  GENERAL:  This is a 77 year old toxic- appearing Caucasian male who is well nourished, well developed, in no acute distress. VITAL SIGNS:  Temperature 97.6, blood pressure 70/54, heart rate 138, respirations 24, and O2 sats 88% on 4 liters. HEENT:  Normocephalic and atraumatic.  The patient has crusting yellow exudate in the right greater than left eye.  Pupils are equally round and reactive to light.  Extraocular movements are intact.  Funduscopic benign.  There is no scleral icterus.  Sclerae are erythematous, however.  Nares patent bilaterally.  Oropharynx is clear.  Mucosa is dry. NECK:  Supple.  Full range of motion.  No thyromegaly, adenopathy, or jugular venous distention. CARDIOVASCULAR:  Tachycardiac rate and rhythm.  No murmurs, gallops, or rubs appreciated. LUNGS:  Coarse rhonchorous breath sounds, decreased bilaterally. Breathing is unlabored at this time. ABDOMEN:  Positive bowel sounds, soft, nontender, and  nondistended.  No hepatosplenomegaly. EXTREMITIES:  Without cyanosis, clubbing, or edema. NEUROLOGIC:  Generalized weakness.  There appear to be no focal deficits on examination.  The patient, however, is somnolent and mildly confused.  LABORATORY STUDIES:  Laboratory studies have been repeated.  His white blood cell count 8.5, hemoglobin 10.6, hematocrit 33.7, platelets 269, neutrophils 72%, and lymphocytes 12%.  Sodium 140, potassium 5.8, chloride 117, CO2 of 13, BUN 122, creatinine 5.79, glucose 103, alkaline phosphatase 47, total bilirubin 0.30,  AST 16, ALT 14, albumin 2.3, and calcium 8.4.  Protime 17.0, INR 1.36, and PTT 20.  Lactic acid level 1.0.  Procalcitonin 0.58.  Cardiac enzymes with creatine kinase of 156, CK-MB 6.6, troponin 0.02, relative index 4.2.  Beta-natriuretic peptide 104.0.  Urinalysis reveals moderate bilirubin, moderate blood, 30 protein, nitrites are negative and leukocyte esterase is negative.  MRSA PCR screening is negative.  Chest x-ray reveals bibasilar airspace opacities and low lung volumes.  ASSESSMENT:  This is a 77 year old male being admitted with: 1. Sepsis/sepsis syndrome. 2. Hypotension. 3. Acute renal failure. 4. Hyperkalemia. 5. Early pneumonia. 6. Paroxysmal atrial fibrillation with rapid ventricular response. 7. Sinus tachycardia.  PLAN:  The patient has been admitted to the ICU and has been started on fluid resuscitation secondary to his hypotension and tachycardia.  The sepsis protocol will be initiated and Critical Care Medicine has been consulted.  Blood cultures x2 have been ordered and the patient has been empirically placed on antibiotic therapy of Zosyn and azithromycin at this time..  A central line has been placed per Critical Care.  The patient's blood pressure is responding to fluid resuscitation.  The patient's regular medications will be further verified.  His blood pressure medications will be held at this time since his blood pressures are low.  Cardiac enzymes will also continue to be performed.  Also, a sputum has been sent for culture and sensitivity.  The patient has also been placed on droplet precautions.  The patient has been placed on DVT prophylaxis at this time.  The patient's hyperkalemia has been trending downward with Kayexalate therapy that had been given and the short-term therapy had also been given and as well as with the IV fluids.  His potassium level will be further monitored.  In the event, the patient needs further treatment with Kayexalate  therapy.  PROGRESSION:  The patient's blood pressure continued to be labile and Levophed was started as a pressor and further workup will ensue pending results and the patient's clinical course.  He is a full code at this time.     Jana Hakim, M.D.     HJ/MEDQ  D:  05/17/2010  T:  05/17/2010  Job:  [id number]  Electronically Signed by Jana Hakim M.D. on 05/31/2010 08:24:53 PM

## 2010-05-31 NOTE — H&P (Signed)
  NAME:  Robert Chapman, Robert Chapman NO.:  [id number]  MEDICAL RECORD NO.:  [id number]           PATIENT TYPE:  I  LOCATION:  [id number]                         FACILITY:  [facility]  PHYSICIAN:  Jana Hakim, M.D. DATE OF BIRTH:  1933-07-07  DATE OF ADMISSION:  05/16/2010 DATE OF DISCHARGE:                             HISTORY & PHYSICAL   ADDENDUM:  DATE OF ADMISSION:  May 16, 2010.  Further review of the patient's laboratory studies that have returned, a D-dimer was performed in which results were 6.06, and in light of the patient having paroxysmal atrial fibrillation as well, the patient has been started on a low-dose IV heparin drip and a V/Q scan will be ordered in the a.m. to evaluate for possible pulmonary embolism.  Venous Doppler studies have also been ordered of both lower extremities to evaluate for thrombus, and also there is a strong suspicion, the patient may have H1N1.  His lactic acid level returned within normal limits as well as the procalcitonin level.  However, the patient is behaving as if he is septic and the patient may have H1N1 infection, and a rapid flu test has also been ordered.  Empiric therapy will also be broadened and the patient will be placed on Tamiflu therapy 75 mg one p.o. b.i.d. for 5 days.  This will be discontinued if the study returns negative for influenza.     Jana Hakim, M.D.     HJ/MEDQ  D:  05/17/2010  T:  05/17/2010  Job:  [id number]  Electronically Signed by Jana Hakim M.D. on 05/31/2010 08:24:59 PM

## 2010-06-08 ENCOUNTER — Other Ambulatory Visit: Payer: Medicare Other

## 2010-06-08 ENCOUNTER — Ambulatory Visit (INDEPENDENT_AMBULATORY_CARE_PROVIDER_SITE_OTHER)
Admission: RE | Admit: 2010-06-08 | Discharge: 2010-06-08 | Disposition: A | Discharge status: Home / Self Care | Payer: Medicare Other | Source: Ambulatory Visit | Attending: Internal Medicine | Admitting: Internal Medicine

## 2010-06-08 ENCOUNTER — Institutional Professional Consult (permissible substitution) (INDEPENDENT_AMBULATORY_CARE_PROVIDER_SITE_OTHER): Payer: Medicare Other | Admitting: Internal Medicine

## 2010-06-08 ENCOUNTER — Encounter: Payer: Self-pay | Admitting: Internal Medicine

## 2010-06-08 ENCOUNTER — Other Ambulatory Visit: Payer: Self-pay | Admitting: Internal Medicine

## 2010-06-08 DIAGNOSIS — N179 Acute kidney failure, unspecified: Secondary | ICD-10-CM

## 2010-06-08 DIAGNOSIS — I252 Old myocardial infarction: Secondary | ICD-10-CM | POA: Insufficient documentation

## 2010-06-08 DIAGNOSIS — I219 Acute myocardial infarction, unspecified: Secondary | ICD-10-CM

## 2010-06-08 DIAGNOSIS — D631 Anemia in chronic kidney disease: Secondary | ICD-10-CM

## 2010-06-08 DIAGNOSIS — J9600 Acute respiratory failure, unspecified whether with hypoxia or hypercapnia: Secondary | ICD-10-CM

## 2010-06-08 DIAGNOSIS — N039 Chronic nephritic syndrome with unspecified morphologic changes: Secondary | ICD-10-CM | POA: Insufficient documentation

## 2010-06-08 DIAGNOSIS — N189 Chronic kidney disease, unspecified: Secondary | ICD-10-CM

## 2010-06-08 HISTORY — DX: Acute myocardial infarction, unspecified: I21.9

## 2010-06-08 HISTORY — DX: Acute kidney failure, unspecified: N17.9

## 2010-06-08 HISTORY — DX: Acute respiratory failure, unspecified whether with hypoxia or hypercapnia: J96.00

## 2010-06-08 HISTORY — DX: Anemia in chronic kidney disease: D63.1

## 2010-06-08 HISTORY — DX: Old myocardial infarction: I25.2

## 2010-06-08 LAB — CBC WITH DIFFERENTIAL/PLATELET
Basophils Absolute: 0.1 10*3/uL (ref 0.0–0.1)
Eosinophils Relative: 1.3 % (ref 0.0–5.0)
MCV: 94.9 fl (ref 78.0–100.0)
Monocytes Absolute: 0.6 10*3/uL (ref 0.1–1.0)
Neutrophils Relative %: 76.3 % (ref 43.0–77.0)
Platelets: 454 10*3/uL — ABNORMAL HIGH (ref 150.0–400.0)
RDW: 13.8 % (ref 11.5–14.6)
WBC: 12.2 10*3/uL — ABNORMAL HIGH (ref 4.5–10.5)

## 2010-06-08 LAB — BASIC METABOLIC PANEL
BUN: 51 mg/dL — ABNORMAL HIGH (ref 6–23)
Chloride: 95 mEq/L — ABNORMAL LOW (ref 96–112)
Creatinine, Ser: 3 mg/dL — ABNORMAL HIGH (ref 0.4–1.5)
GFR: 21.89 mL/min — ABNORMAL LOW (ref >60.00)
Glucose, Bld: 119 mg/dL — ABNORMAL HIGH (ref 70–99)

## 2010-06-09 ENCOUNTER — Telehealth: Payer: Self-pay | Admitting: Internal Medicine

## 2010-06-09 NOTE — Discharge Summary (Signed)
NAME:  Robert Chapman, Robert Chapman NO.:  [id number]  MEDICAL RECORD NO.:  [id number]           PATIENT TYPE:  I  LOCATION:  2012                         FACILITY:  [facility]  PHYSICIAN:  Estill Cotta, MD       DATE OF BIRTH:  09/20/1933  DATE OF ADMISSION:  05/16/2010 DATE OF DISCHARGE:  05/24/2010                              DISCHARGE SUMMARY   PRIMARY CARE PHYSICIAN:  [facility].  DISCHARGE DIAGNOSES: 1. Vent-dependent respiratory failure status post extubation. 2. Tooth abscess. 3. Metabolic acidosis with encephalopathy resolved. 4. Acute on chronic kidney disease. 5. Atrial fibrillation, currently on anticoagulation. 6. Anemia. 7. Hypertension. 8. Coronary artery disease status post CABG. 9. Dyslipidemia. 10.Leukocytosis likely secondary to the steroid use. 11.Hypomagnesemia, replaced.  CONSULTATIONS: 1. Pulmonary Critical Care Management. 2. Cardiology.  DISCHARGE MEDICATIONS: 1. Symbicort 80/4.5 mcg 2 puff inhaled q.12 h. 2. Combivent inhaler 2 puffs inhaled b.i.d. and q.4 h. p.r.n.     shortness of breath. 3. Lasix 40 mg p.o. b.i.d. 4. Robitussin DM syrup 5 mL p.o. every 4 hours as needed. 5. Potassium chloride 20 mEq p.o. daily. 6. Protonix 40 mg p.o. daily at bedtime. 7. Coumadin 5 mg p.o. q.p.m. adjust dose according to PT/INR. 8. Amlodipine 10 mg p.o. daily. 9. Atenolol 25 mg p.o. daily. 10.Fish oil 1000 mg 1 capsule p.o. b.i.d. 11.Levothyroxine 125 mEq p.o. daily. 12.Multivitamin 1 tablet p.o. daily. 13.Niacin SR 500 mg p.o. daily. 14.Simvastatin 20 mg p.o. daily. 15.Magnesium oxide 400 mg p.o. b.i.d. for one weeks.  BRIEF HISTORY OF PRESENT ILLNESS:  Briefly, Robert Chapman is a 77 year old male who was admitted on May 16, 2010 with 3-day history of diarrhea, progressive dyspnea and weakness.  The patient was also found to have acute renal insufficiency with anion gap acidosis with atrial fibrillation with rapid ventricular rate.  He  was subsequently intubated and mechanically ventilated by Pulmonary Critical Care Medicine.  RADIOLOGIC DATA:  Chest x-ray portable on May 16, 2010, lower lobe volumes with bibasilar atelectasis and consolidation with left worse than right.  Chest x-ray on May 16, 2010, central line tube, cavoatrial junction without pneumothorax, persistent low lung volumes and bibasilar airspace opacities.  Chest x-ray on May 18, 2019, interval intubation, endotracheal tube in the satisfactory position, otherwise stable low lung volumes and bibasilar atelectasis.  Chest x- ray on May 18, 2010, stable position of the porta hepatis, no change in the asymmetric elevation, left hemidiaphragm with overlying atelectasis.  PERTINENT LABORATORY/DIAGNOSTIC DATA:  BMP; sodium 142, potassium 3.5, BUN 24, creatinine 2.1, albumin 2.2, calcium 8.3, at the time of admission sodium was 140, potassium 5.8, BUN 122, and creatinine 5.79. CBC; white count 11.9, hemoglobin 9.0, hematocrit 28.5, and platelets 266,000.  Blood cultures remained negative till date.  Stool cultures negative.  BRIEF HOSPITALIZATION COURSE:  Robert Chapman is a 77 year old male who was initially admitted with shortness of breath, malaise, and poor appetite with acute renal insufficiency, creatinine of 7.23 and potassium level initially 7.0 at [facility] Emergency Department. The patient was transferred to [facility] for further workup. The patient was found to have increased  somnolence with worsening ABG.  1. Acute respiratory failure.  The patient was found to have     progressive dyspnea, weakness, and confusion.  The patient was seen     by Pulmonary Critical Care Medicine and was intubated on May 17, 2010.  He was subsequently extubated.  Per PCCM, possibly the     patient had undiagnosed COPD resulting in the acute exacerbation     and subsequent altered mental status due to renal failure and      metabolic acidosis.  He was placed on aggressive pulmonary     toileting, Symbicort, and albuterol and Atrovent nebs.  At the time     of the discharge, the patient had the presence of the inhalers over     nebulizers, hence was placed on Combivent inhaler.  The patient has     a follow up appointment with Dr. Chase Caller on June 08, 2010.     Given the possibility of sepsis, the patient was placed on     Zithromax, Zosyn, and Tamiflu.  Given his D-dimer of 6, there was a     concern, hence the patient was started on heparin drip.  Bilateral     venous Dopplers showed no obvious DVTs in the bilateral lower     extremity.  He also required Levophed for the septic shock. 2. Severe metabolic acidosis with increased anion gap and acute renal     insufficiency on chronic kidney disease.  Possibly due to septic     shock and dehydration, he was placed on IV fluid hydration.  By the     time of the discharge, the creatinine function has improved to 2.1     from 7 at the time of admission. 3. Atrial fibrillation with RVR and hemodynamic instability.  [facility] was consulted and the patient was seen by Dr. Martinique.     Per Cardiology, the patient is not a candidate for Pradaxa     secondary to renal insufficiency and the patient was started on     warfarin.  His INR has been therapeutic today at 2.10 hence heparin     drip has been discontinued. 4. Deconditioning and debility secondary to acute illness.  The     patient was seen by physical therapy and recommended home health     PT/OT.  The patient will have a home oxygen evaluation prior to the     discharge and home physical therapy will be set up prior to the     discharge.  Discharge follow up with Dr. Peter Martinique in the next 2     weeks and Dr. Chase Caller on June 08, 2010.  PHYSICAL EXAMINATION:  VITAL SIGNS:  At the time of this dictation temp 99.0, pulse 74, respirations 18, blood pressure 122/55, and O2 sats 92% on room air.   GENERAL:  The patient is alert, awake, and oriented x3 not in acute distress.  HEENT:  Anicteric sclerae.  Pink conjunctiva. Pupils are reacting to light and accommodation.  EOMI.  NECK:  Supple. No lymphopathy no JVD.  CVS:  S1, S2 clear.  CHEST:  Fairly clear to auscultation bilaterally.  ABDOMEN:  Soft, nontender, and nondistended. Normal bowel sounds.  EXTREMITIES:  Trace edema.  No cyanosis or clubbing.  FOLLOWUP:  Discharge follow-up with Dr. Chase Caller on June 08, 2010 and Dr. Peter Martinique within the next 2 weeks.     Estill Cotta, MD  RR/MEDQ  D:  05/24/2010  T:  05/24/2010  Job:  [id number]  cc:   Peter M. Martinique, M.D. Brand Males, MD  Electronically Signed by Nira Conn Aimee Timmons  on 06/09/2010 07:44:21 AM

## 2010-06-14 ENCOUNTER — Telehealth: Payer: Self-pay | Admitting: Internal Medicine

## 2010-06-15 ENCOUNTER — Telehealth: Payer: Self-pay | Admitting: Internal Medicine

## 2010-06-15 NOTE — Assessment & Plan Note (Signed)
Summary: HFU PER MR //kp   Visit Type:  Hospital Follow-up Primary Provider/Referring Provider:  Marliss Czar MD -[place]  CC:  HFU. Pt states he is feeling better since being out of the hospital. Pt states his breathing has been doing "well". Pt has very little cough.  History of Present Illness: June 08, 2010 Post ICU followup. Admitted mid-end feb 2012 for diarrhe related acute renal failure, metabolic acidosis and had altered mental status and was intubated. Possible pneumonoia as well .HAd some cough post icu in hospital., Now followin up. Feels well. Almost back to baseline. Denies cough, dyspnea, chest pain, wheeze, runny nose. Last creat at discharge 2.2mg %. No new complaints.   Preventive Screening-Counseling & Management  Alcohol-Tobacco     Smoking Status: never  Current Medications (verified): 1)  Fish Oil 1000 Mg Caps (Omega-3 Fatty Acids) .Marland Kitchen.. 1 Two Times A Day 2)  Atenolol 25 Mg Tabs (Atenolol) .... Once Daily 3)  Zocor 20 Mg Tabs (Simvastatin) .... Once Daily 4)  Levothyroxine Sodium 125 Mcg Tabs (Levothyroxine Sodium) .... Once Daily 5)  Combivent 18-103 Mcg/act Aero (Ipratropium-Albuterol) .... 2 Puffs Every 4 Hrs As Needed 6)  Symbicort 80-4.5 Mcg/act Aero (Budesonide-Formoterol Fumarate) .... 2 Puffs Two Times A Day 7)  Warfarin Sodium 5 Mg Tabs (Warfarin Sodium) .... As Directed 8)  Amlodipine Besylate 10 Mg Tabs (Amlodipine Besylate) .... Once Daily 9)  Klor-Con 20 Meq Pack (Potassium Chloride) .... Once Daily 10)  Furosemide 40 Mg Tabs (Furosemide) .Marland Kitchen.. 1 Two Times A Day 11)  Pantoprazole Sodium 40 Mg Tbec (Pantoprazole Sodium) .... Once Daily At Bedtime  Allergies (verified): No Known Drug Allergies  Past History:  Past medical, surgical, family and social histories (including risk factors) reviewed, and no changes noted (except as noted below).  Past Medical History: coronary artery disease htn dyslipidemia TIA hypothyroidism Myocardial  Infarction  Past Surgical History: Reviewed history from 06/07/2010 and no changes required. post CABG  Family History: Reviewed history from 06/07/2010 and no changes required. [id number] colon cancer: father  Social History: Reviewed history from 06/07/2010 and no changes required. married retired worked at [employer] in [place] Patient never smoked.  lives with wifeSmoking Status:  never  Review of Systems       The patient complains of irregular heartbeats, loss of appetite, and weight change.  The patient denies shortness of breath with activity, shortness of breath at rest, productive cough, non-productive cough, coughing up blood, chest pain, acid heartburn, indigestion, abdominal pain, difficulty swallowing, sore throat, tooth/dental problems, headaches, nasal congestion/difficulty breathing through nose, sneezing, itching, ear ache, anxiety, depression, hand/feet swelling, joint stiffness or pain, rash, change in color of mucus, and fever.    Vital Signs:  Patient profile:   77 year old male Height:      68 inches Weight:      203.13 pounds BMI:     31.00 O2 Sat:      95 % on Room air Temp:     98 degrees F oral Pulse rate:   54 / minute BP sitting:   106 / 60  (left arm) Cuff size:   regular  Vitals Entered By: Charma Igo (June 08, 2010 3:48 PM)  O2 Flow:  Room air CC: HFU. Pt states he is feeling better since being out of the hospital. Pt states his breathing has been doing "well". Pt has very little cough Comments meds and allergies updated Charma Igo  June 08, 2010 3:50 PM  Physical Exam  General:  well developed, well nourished, in no acute distressobese.   Head:  normocephalic and atraumatic Eyes:  PERRLA/EOM intact; conjunctiva and sclera clear Ears:  TMs intact and clear with normal canals Nose:  no deformity, discharge, inflammation, or lesions Mouth:  no deformity or lesions Neck:  no masses, thyromegaly, or abnormal  cervical nodes Chest Wall:  no deformities noted Lungs:  clear bilaterally to auscultation and percussion Heart:  regular rate and rhythm, S1, S2 without murmurs, rubs, gallops, or clicks Abdomen:  bowel sounds positive; abdomen soft and non-tender without masses, or organomegaly Msk:  no deformity or scoliosis noted with normal posture Pulses:  pulses normal Extremities:  no clubbing, cyanosis, edema, or deformity noted Neurologic:  CN II-XII grossly intact with normal reflexes, coordination, muscle strength and tone Skin:  intact without lesions or rashes Cervical Nodes:  no significant adenopathy Axillary Nodes:  no significant adenopathy Psych:  alert and cooperative; normal mood and affect; normal attention span and concentration   Impression & Recommendations:  Problem # 1:  ACUTE RESPIRATORY FAILURE (ICD-518.81) Assessment Improved will get cxr to ensure no persistent infilrates might dc symbicort and combivent based on results Orders: T-2 View CXR (71020TC) Est. Patient Level III [id number]  Problem # 2:  ANEMIA IN CHRONIC KIDNEY DISEASE (ICD-285.21) Assessment: Unchanged check cbc Orders: TLB-BMP (Basic Metabolic Panel-BMET) (99991111) TLB-CBC Platelet - w/Differential (85025-CBCD) Est. Patient Level III [id number])  Problem # 3:  ACUTE KIDNEY FAILURE UNSPECIFIED (ICD-584.9) Assessment: Unchanged check bmet Orders: TLB-BMP (Basic Metabolic Panel-BMET) (99991111) TLB-CBC Platelet - w/Differential (85025-CBCD) Est. Patient Level III [id number])  Medications Added to Medication List This Visit: 1)  Combivent 18-103 Mcg/act Aero (Ipratropium-albuterol) .... 2 puffs every 4 hrs as needed 2)  Symbicort 80-4.5 Mcg/act Aero (Budesonide-formoterol fumarate) .... 2 puffs two times a day 3)  Warfarin Sodium 5 Mg Tabs (Warfarin sodium) .... As directed 4)  Amlodipine Besylate 10 Mg Tabs (Amlodipine besylate) .... Once daily 5)  Klor-con 20 Meq Pack (Potassium chloride) ....  Once daily 6)  Furosemide 40 Mg Tabs (Furosemide) .Marland Kitchen.. 1 two times a day 7)  Pantoprazole Sodium 40 Mg Tbec (Pantoprazole sodium) .... Once daily at bedtime  Patient Instructions: 1)  pleaes have blood tests for blood count and chemistry 2)  pleaes have cxr as well for completeness 3)  i will send those results to Dr Geraldo Pitter and Dr Huey Bienenstock 4)  if these look okay no need for further followup here 5)  will keep you posted 6)  mighgt dc combivent/symbicort based on resuls   Immunization History:  Influenza Immunization History:    Influenza:  historical (01/26/2010)  Pneumovax Immunization History:    Pneumovax:  historical (01/26/2009)

## 2010-06-15 NOTE — Telephone Encounter (Signed)
Ok i called and told him. He refused. I have asked Delsa Sale to inform his PMD

## 2010-06-15 NOTE — Telephone Encounter (Signed)
Pt does not want to see nephrology because he feels better and thinks his renal functions were up because he was dehydrated

## 2010-06-15 NOTE — Telephone Encounter (Signed)
Advised him of creat 3mg %. He refused referral to a renal doc. HE stated he will talk to his primary doc about it. Will attempt to contact his primary care doc

## 2010-06-15 NOTE — Progress Notes (Signed)
Summary: needs renal doc  Phone Note Outgoing Call   Summary of Call: creat is 3mg % and up from 2.2 at dc. he needs to see renal. He prefers one in [place]. Pls refer him to one there Initial call taken by: Brand Males MD,  June 09, 2010 1:49 PM  Follow-up for Phone Call        order sent to [facility]. [place] Bing CMA  June 10, 2010 2:30 PM

## 2010-06-18 NOTE — Telephone Encounter (Signed)
Forms faxed to PCP Wyatt Portela, MD. [place] Bing, CMA

## 2010-06-20 NOTE — Telephone Encounter (Signed)
Robert Chapman, Please make sure you call PMD office and tell them creat is 3mg % and patient refusing referral. You can tell the triage nurse there. And, also I need old cxr reports from few years ago about his raised left diaphragme

## 2010-06-23 ENCOUNTER — Encounter: Payer: Self-pay | Admitting: Internal Medicine

## 2010-06-24 NOTE — Progress Notes (Signed)
Summary: old cxr report needed  Phone Note Outgoing Call   Summary of Call: please call patient and find out where he had his CABG and get cxr reports from that hospital and also from his primary care doc or cardiologist. I need cxr reports dating back atleast 2-3 years Initial call taken by: Brand Males MD,  June 14, 2010 9:37 PM  Follow-up for Phone Call        see epic. Marland Kitchensing

## 2011-07-23 IMAGING — CR DG CHEST 1V PORT
1 series · 1 of 1 positions shown · non-contrast
Comparison: Earlier film of the same day

CLINICAL DATA: Central line placement, sepsis

PORTABLE CHEST - 1 VIEW

[view not recorded]
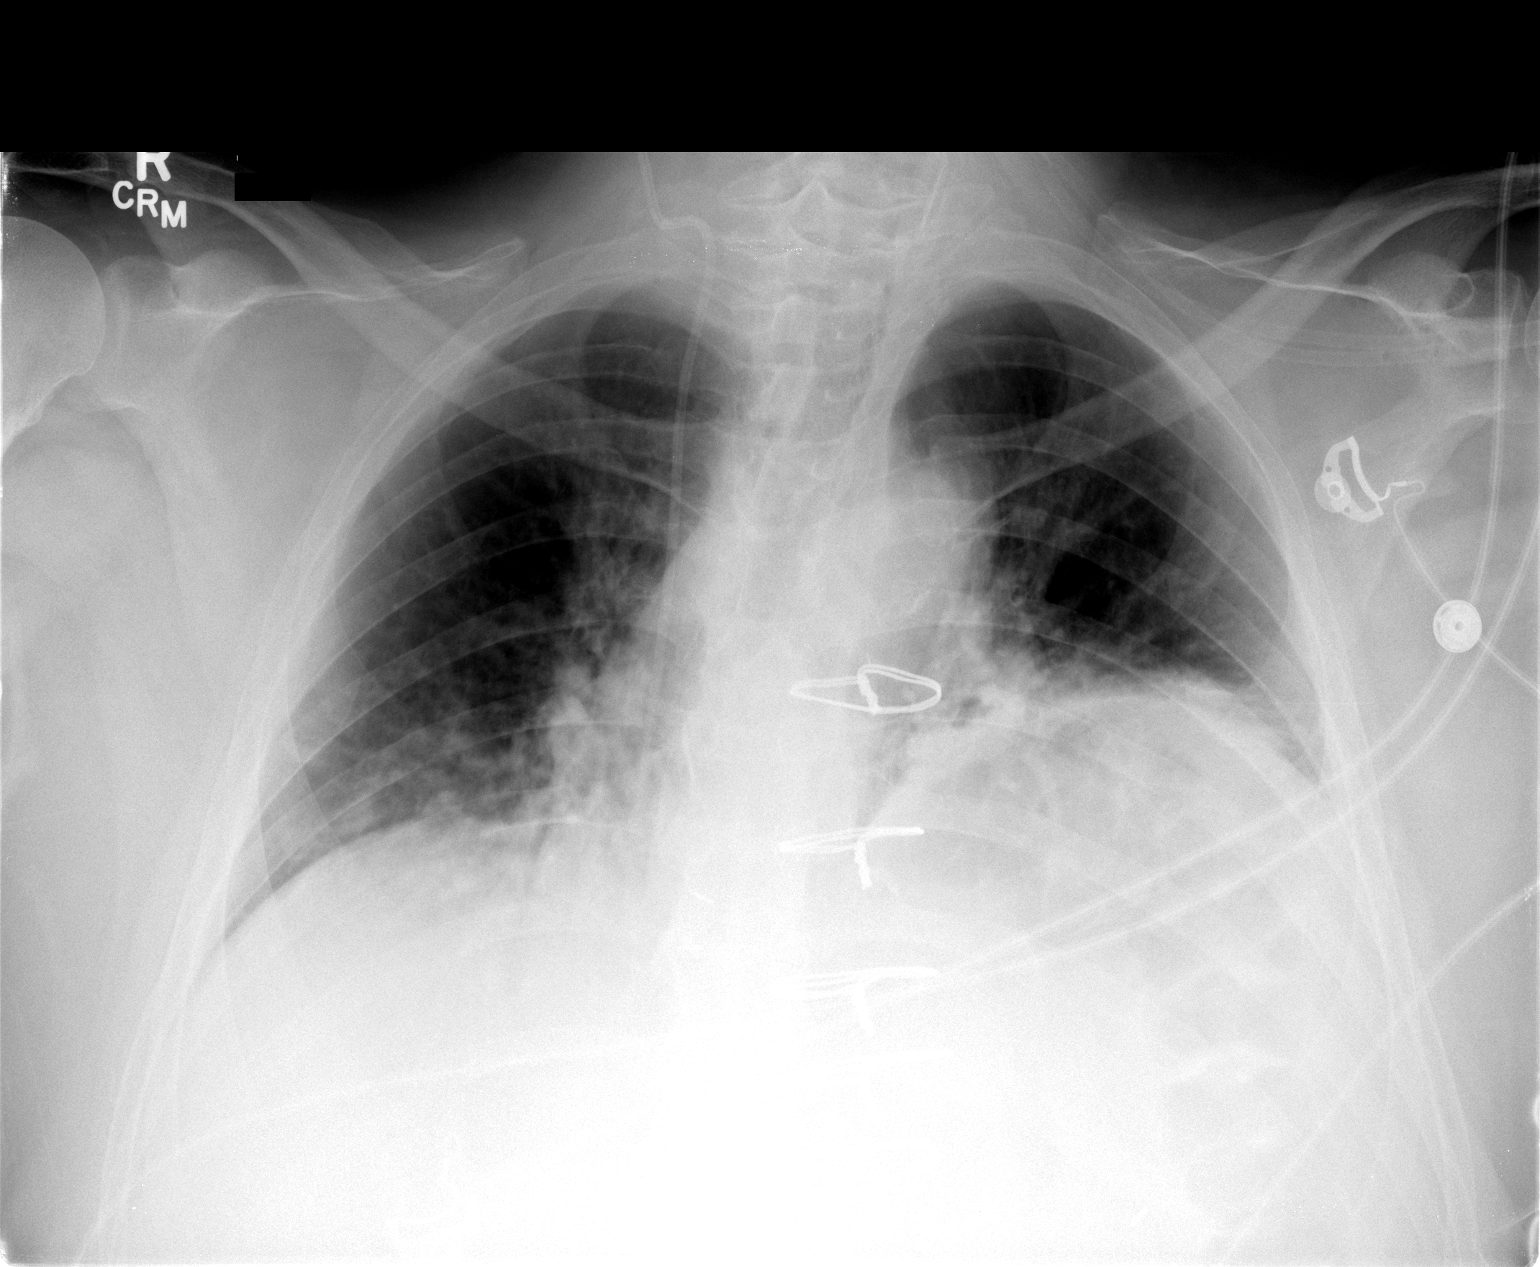

[1 of 1 positions shown; findings below may reference images not displayed]

FINDINGS: Right IJ central has been placed, tip near the cavoatrial
junction.  No pneumothorax.  Persistent low lung volumes with
patchy airspace opacities in both lung bases.  Previous median
sternotomy.  Heart size difficult to assess due to adjacent
opacities.  No definite effusion.
IMPRESSION: 1.  Central line  to cavoatrial junction without pneumothorax.
2.  Persistent low lung volumes and bibasilar airspace opacities.

## 2011-07-25 IMAGING — CR DG CHEST 1V PORT
1 series · 1 of 1 positions shown · non-contrast
Comparison: 05/17/2010

CLINICAL DATA: Evaluate ET tube placement

PORTABLE CHEST - 1 VIEW

[AP]
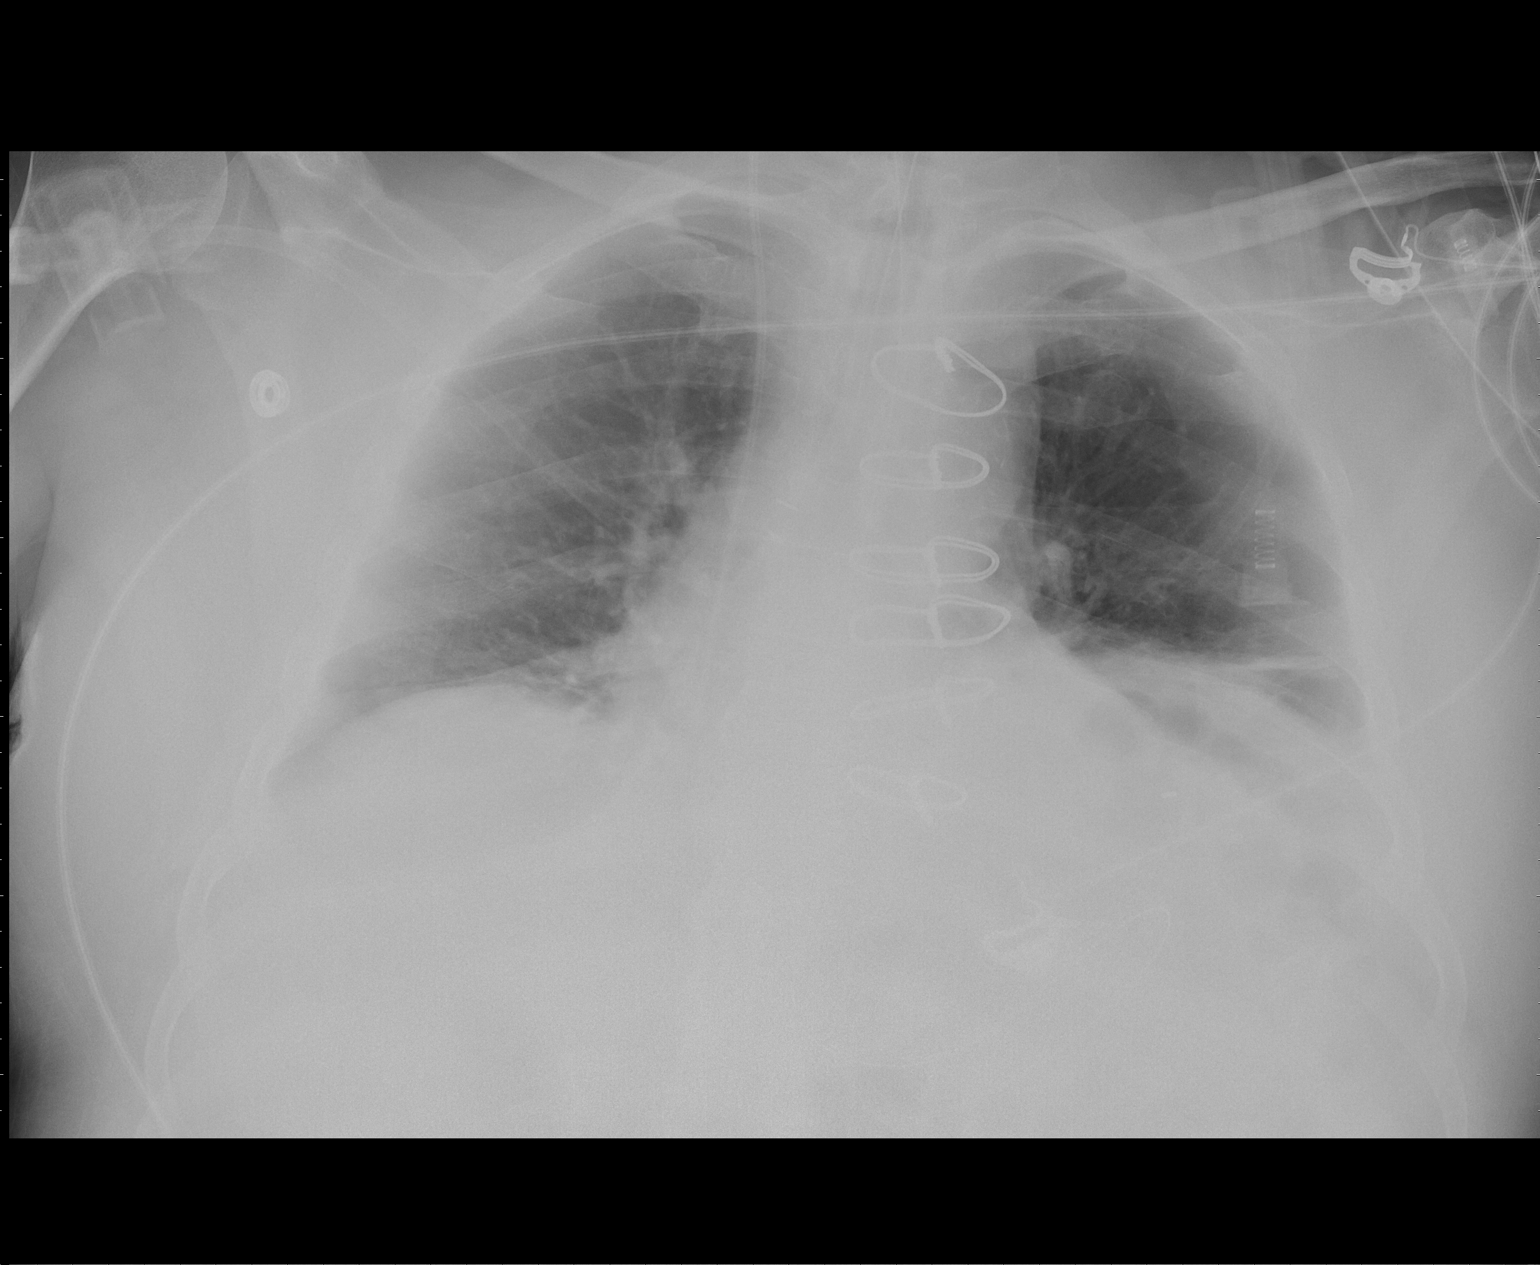

[1 of 1 positions shown; findings below may reference images not displayed]

FINDINGS: There is a right IJ catheter with tip in the right
atrium.  The ET tube tip is stable above the carina.

Nasogastric tube is identified coiled in the stomach.

The lung volumes are low.  There is chronic asymmetric elevation
left hemidiaphragm.  Atelectasis is noted within the left base
which is similar to prior exam.
IMPRESSION: 1.  Stable position of the support apparatus.
2.  No change in asymmetric elevation left hemidiaphragm with
overlying atelectasis

## 2014-04-15 DIAGNOSIS — N189 Chronic kidney disease, unspecified: Secondary | ICD-10-CM | POA: Diagnosis not present

## 2014-04-15 DIAGNOSIS — E559 Vitamin D deficiency, unspecified: Secondary | ICD-10-CM | POA: Diagnosis not present

## 2014-04-15 DIAGNOSIS — Z1389 Encounter for screening for other disorder: Secondary | ICD-10-CM | POA: Diagnosis not present

## 2014-04-15 DIAGNOSIS — I10 Essential (primary) hypertension: Secondary | ICD-10-CM | POA: Diagnosis not present

## 2014-04-15 DIAGNOSIS — N401 Enlarged prostate with lower urinary tract symptoms: Secondary | ICD-10-CM | POA: Diagnosis not present

## 2014-04-15 DIAGNOSIS — I4891 Unspecified atrial fibrillation: Secondary | ICD-10-CM | POA: Diagnosis not present

## 2014-04-15 DIAGNOSIS — Z23 Encounter for immunization: Secondary | ICD-10-CM | POA: Diagnosis not present

## 2014-04-15 DIAGNOSIS — E782 Mixed hyperlipidemia: Secondary | ICD-10-CM | POA: Diagnosis not present

## 2014-04-15 DIAGNOSIS — Z9181 History of falling: Secondary | ICD-10-CM | POA: Diagnosis not present

## 2014-04-29 DIAGNOSIS — Z7901 Long term (current) use of anticoagulants: Secondary | ICD-10-CM | POA: Diagnosis not present

## 2014-04-29 DIAGNOSIS — I4891 Unspecified atrial fibrillation: Secondary | ICD-10-CM | POA: Diagnosis not present

## 2014-05-01 DIAGNOSIS — L570 Actinic keratosis: Secondary | ICD-10-CM | POA: Diagnosis not present

## 2014-05-27 DIAGNOSIS — I480 Paroxysmal atrial fibrillation: Secondary | ICD-10-CM | POA: Diagnosis not present

## 2014-05-27 DIAGNOSIS — Z7901 Long term (current) use of anticoagulants: Secondary | ICD-10-CM | POA: Diagnosis not present

## 2014-07-01 DIAGNOSIS — Z7901 Long term (current) use of anticoagulants: Secondary | ICD-10-CM | POA: Diagnosis not present

## 2014-07-17 DIAGNOSIS — I4891 Unspecified atrial fibrillation: Secondary | ICD-10-CM | POA: Diagnosis not present

## 2014-07-17 DIAGNOSIS — Z79899 Other long term (current) drug therapy: Secondary | ICD-10-CM | POA: Diagnosis not present

## 2014-07-17 DIAGNOSIS — Z0000 Encounter for general adult medical examination without abnormal findings: Secondary | ICD-10-CM | POA: Diagnosis not present

## 2014-07-17 DIAGNOSIS — Z125 Encounter for screening for malignant neoplasm of prostate: Secondary | ICD-10-CM | POA: Diagnosis not present

## 2014-07-17 DIAGNOSIS — M109 Gout, unspecified: Secondary | ICD-10-CM | POA: Diagnosis not present

## 2014-07-17 DIAGNOSIS — E559 Vitamin D deficiency, unspecified: Secondary | ICD-10-CM | POA: Diagnosis not present

## 2014-07-17 DIAGNOSIS — K635 Polyp of colon: Secondary | ICD-10-CM | POA: Diagnosis not present

## 2014-07-17 DIAGNOSIS — I10 Essential (primary) hypertension: Secondary | ICD-10-CM | POA: Diagnosis not present

## 2014-07-17 DIAGNOSIS — E782 Mixed hyperlipidemia: Secondary | ICD-10-CM | POA: Diagnosis not present

## 2014-07-30 DIAGNOSIS — I10 Essential (primary) hypertension: Secondary | ICD-10-CM | POA: Diagnosis not present

## 2014-08-26 DIAGNOSIS — Z7901 Long term (current) use of anticoagulants: Secondary | ICD-10-CM | POA: Diagnosis not present

## 2014-08-27 DIAGNOSIS — D128 Benign neoplasm of rectum: Secondary | ICD-10-CM | POA: Diagnosis not present

## 2014-08-27 DIAGNOSIS — K621 Rectal polyp: Secondary | ICD-10-CM | POA: Diagnosis not present

## 2014-08-27 DIAGNOSIS — Z800 Family history of malignant neoplasm of digestive organs: Secondary | ICD-10-CM | POA: Diagnosis not present

## 2014-08-27 DIAGNOSIS — Z86010 Personal history of colonic polyps: Secondary | ICD-10-CM | POA: Diagnosis not present

## 2014-08-27 DIAGNOSIS — D125 Benign neoplasm of sigmoid colon: Secondary | ICD-10-CM | POA: Diagnosis not present

## 2014-09-09 DIAGNOSIS — Z7901 Long term (current) use of anticoagulants: Secondary | ICD-10-CM | POA: Diagnosis not present

## 2014-09-09 DIAGNOSIS — I480 Paroxysmal atrial fibrillation: Secondary | ICD-10-CM | POA: Diagnosis not present

## 2014-10-08 DIAGNOSIS — E7800 Pure hypercholesterolemia, unspecified: Secondary | ICD-10-CM | POA: Insufficient documentation

## 2014-10-08 DIAGNOSIS — Z7901 Long term (current) use of anticoagulants: Secondary | ICD-10-CM | POA: Diagnosis not present

## 2014-10-08 DIAGNOSIS — I480 Paroxysmal atrial fibrillation: Secondary | ICD-10-CM | POA: Diagnosis not present

## 2014-10-08 DIAGNOSIS — N189 Chronic kidney disease, unspecified: Secondary | ICD-10-CM | POA: Insufficient documentation

## 2014-10-08 DIAGNOSIS — I10 Essential (primary) hypertension: Secondary | ICD-10-CM | POA: Diagnosis not present

## 2014-10-08 HISTORY — DX: Pure hypercholesterolemia, unspecified: E78.00

## 2014-10-08 HISTORY — DX: Essential (primary) hypertension: I10

## 2014-10-08 HISTORY — DX: Paroxysmal atrial fibrillation: I48.0

## 2014-10-08 HISTORY — DX: Chronic kidney disease, unspecified: N18.9

## 2014-10-30 DIAGNOSIS — L820 Inflamed seborrheic keratosis: Secondary | ICD-10-CM | POA: Diagnosis not present

## 2014-10-30 DIAGNOSIS — D1801 Hemangioma of skin and subcutaneous tissue: Secondary | ICD-10-CM | POA: Diagnosis not present

## 2014-10-30 DIAGNOSIS — D225 Melanocytic nevi of trunk: Secondary | ICD-10-CM | POA: Diagnosis not present

## 2014-11-11 DIAGNOSIS — I482 Chronic atrial fibrillation: Secondary | ICD-10-CM | POA: Diagnosis not present

## 2014-11-17 DIAGNOSIS — I4891 Unspecified atrial fibrillation: Secondary | ICD-10-CM | POA: Diagnosis not present

## 2014-11-17 DIAGNOSIS — I10 Essential (primary) hypertension: Secondary | ICD-10-CM | POA: Diagnosis not present

## 2014-11-17 DIAGNOSIS — E782 Mixed hyperlipidemia: Secondary | ICD-10-CM | POA: Diagnosis not present

## 2014-11-17 DIAGNOSIS — N189 Chronic kidney disease, unspecified: Secondary | ICD-10-CM | POA: Diagnosis not present

## 2014-11-17 DIAGNOSIS — E039 Hypothyroidism, unspecified: Secondary | ICD-10-CM | POA: Diagnosis not present

## 2014-11-17 DIAGNOSIS — I2510 Atherosclerotic heart disease of native coronary artery without angina pectoris: Secondary | ICD-10-CM | POA: Diagnosis not present

## 2014-12-03 DIAGNOSIS — R791 Abnormal coagulation profile: Secondary | ICD-10-CM | POA: Diagnosis not present

## 2015-01-01 DIAGNOSIS — R791 Abnormal coagulation profile: Secondary | ICD-10-CM | POA: Diagnosis not present

## 2015-01-01 DIAGNOSIS — Z23 Encounter for immunization: Secondary | ICD-10-CM | POA: Diagnosis not present

## 2015-02-03 DIAGNOSIS — R791 Abnormal coagulation profile: Secondary | ICD-10-CM | POA: Diagnosis not present

## 2015-03-05 DIAGNOSIS — Z7901 Long term (current) use of anticoagulants: Secondary | ICD-10-CM | POA: Diagnosis not present

## 2015-03-19 DIAGNOSIS — E039 Hypothyroidism, unspecified: Secondary | ICD-10-CM | POA: Diagnosis not present

## 2015-03-19 DIAGNOSIS — E119 Type 2 diabetes mellitus without complications: Secondary | ICD-10-CM | POA: Diagnosis not present

## 2015-03-19 DIAGNOSIS — E559 Vitamin D deficiency, unspecified: Secondary | ICD-10-CM | POA: Diagnosis not present

## 2015-03-19 DIAGNOSIS — E782 Mixed hyperlipidemia: Secondary | ICD-10-CM | POA: Diagnosis not present

## 2015-03-19 DIAGNOSIS — I4891 Unspecified atrial fibrillation: Secondary | ICD-10-CM | POA: Diagnosis not present

## 2015-03-19 DIAGNOSIS — Z79899 Other long term (current) drug therapy: Secondary | ICD-10-CM | POA: Diagnosis not present

## 2015-03-19 DIAGNOSIS — I10 Essential (primary) hypertension: Secondary | ICD-10-CM | POA: Diagnosis not present

## 2015-04-14 DIAGNOSIS — Z7901 Long term (current) use of anticoagulants: Secondary | ICD-10-CM | POA: Diagnosis not present

## 2015-04-21 DIAGNOSIS — Z1389 Encounter for screening for other disorder: Secondary | ICD-10-CM | POA: Diagnosis not present

## 2015-04-21 DIAGNOSIS — I4891 Unspecified atrial fibrillation: Secondary | ICD-10-CM | POA: Diagnosis not present

## 2015-04-27 DIAGNOSIS — I480 Paroxysmal atrial fibrillation: Secondary | ICD-10-CM | POA: Diagnosis not present

## 2015-04-27 DIAGNOSIS — N189 Chronic kidney disease, unspecified: Secondary | ICD-10-CM | POA: Diagnosis not present

## 2015-04-27 DIAGNOSIS — I10 Essential (primary) hypertension: Secondary | ICD-10-CM | POA: Diagnosis not present

## 2015-04-27 DIAGNOSIS — E7800 Pure hypercholesterolemia, unspecified: Secondary | ICD-10-CM | POA: Diagnosis not present

## 2015-05-04 DIAGNOSIS — I4891 Unspecified atrial fibrillation: Secondary | ICD-10-CM | POA: Diagnosis not present

## 2015-05-04 DIAGNOSIS — R791 Abnormal coagulation profile: Secondary | ICD-10-CM | POA: Diagnosis not present

## 2015-05-04 DIAGNOSIS — J309 Allergic rhinitis, unspecified: Secondary | ICD-10-CM | POA: Diagnosis not present

## 2015-05-04 DIAGNOSIS — Z1389 Encounter for screening for other disorder: Secondary | ICD-10-CM | POA: Diagnosis not present

## 2015-05-04 DIAGNOSIS — J45909 Unspecified asthma, uncomplicated: Secondary | ICD-10-CM | POA: Diagnosis not present

## 2015-05-11 DIAGNOSIS — R791 Abnormal coagulation profile: Secondary | ICD-10-CM | POA: Diagnosis not present

## 2015-06-08 DIAGNOSIS — Z7901 Long term (current) use of anticoagulants: Secondary | ICD-10-CM | POA: Diagnosis not present

## 2015-07-09 DIAGNOSIS — R791 Abnormal coagulation profile: Secondary | ICD-10-CM | POA: Diagnosis not present

## 2015-07-27 DIAGNOSIS — M109 Gout, unspecified: Secondary | ICD-10-CM | POA: Diagnosis not present

## 2015-07-27 DIAGNOSIS — I4891 Unspecified atrial fibrillation: Secondary | ICD-10-CM | POA: Diagnosis not present

## 2015-07-27 DIAGNOSIS — E782 Mixed hyperlipidemia: Secondary | ICD-10-CM | POA: Diagnosis not present

## 2015-07-27 DIAGNOSIS — Z79899 Other long term (current) drug therapy: Secondary | ICD-10-CM | POA: Diagnosis not present

## 2015-07-27 DIAGNOSIS — E039 Hypothyroidism, unspecified: Secondary | ICD-10-CM | POA: Diagnosis not present

## 2015-07-27 DIAGNOSIS — E119 Type 2 diabetes mellitus without complications: Secondary | ICD-10-CM | POA: Diagnosis not present

## 2015-08-10 DIAGNOSIS — Z7901 Long term (current) use of anticoagulants: Secondary | ICD-10-CM | POA: Diagnosis not present

## 2015-09-10 DIAGNOSIS — Z7901 Long term (current) use of anticoagulants: Secondary | ICD-10-CM | POA: Diagnosis not present

## 2015-10-12 DIAGNOSIS — Z7901 Long term (current) use of anticoagulants: Secondary | ICD-10-CM | POA: Diagnosis not present

## 2015-10-27 DIAGNOSIS — I480 Paroxysmal atrial fibrillation: Secondary | ICD-10-CM | POA: Diagnosis not present

## 2015-10-27 DIAGNOSIS — E7800 Pure hypercholesterolemia, unspecified: Secondary | ICD-10-CM | POA: Diagnosis not present

## 2015-10-27 DIAGNOSIS — I10 Essential (primary) hypertension: Secondary | ICD-10-CM | POA: Diagnosis not present

## 2015-10-27 DIAGNOSIS — N189 Chronic kidney disease, unspecified: Secondary | ICD-10-CM | POA: Diagnosis not present

## 2015-11-12 DIAGNOSIS — Z7901 Long term (current) use of anticoagulants: Secondary | ICD-10-CM | POA: Diagnosis not present

## 2015-12-01 DIAGNOSIS — E119 Type 2 diabetes mellitus without complications: Secondary | ICD-10-CM | POA: Diagnosis not present

## 2015-12-01 DIAGNOSIS — I4891 Unspecified atrial fibrillation: Secondary | ICD-10-CM | POA: Diagnosis not present

## 2015-12-01 DIAGNOSIS — I10 Essential (primary) hypertension: Secondary | ICD-10-CM | POA: Diagnosis not present

## 2015-12-01 DIAGNOSIS — E782 Mixed hyperlipidemia: Secondary | ICD-10-CM | POA: Diagnosis not present

## 2015-12-01 DIAGNOSIS — E039 Hypothyroidism, unspecified: Secondary | ICD-10-CM | POA: Diagnosis not present

## 2015-12-02 DIAGNOSIS — E559 Vitamin D deficiency, unspecified: Secondary | ICD-10-CM | POA: Diagnosis not present

## 2015-12-02 DIAGNOSIS — E039 Hypothyroidism, unspecified: Secondary | ICD-10-CM | POA: Diagnosis not present

## 2015-12-02 DIAGNOSIS — E119 Type 2 diabetes mellitus without complications: Secondary | ICD-10-CM | POA: Diagnosis not present

## 2015-12-02 DIAGNOSIS — E782 Mixed hyperlipidemia: Secondary | ICD-10-CM | POA: Diagnosis not present

## 2015-12-02 DIAGNOSIS — Z79899 Other long term (current) drug therapy: Secondary | ICD-10-CM | POA: Diagnosis not present

## 2015-12-07 DIAGNOSIS — I480 Paroxysmal atrial fibrillation: Secondary | ICD-10-CM | POA: Diagnosis not present

## 2015-12-07 DIAGNOSIS — N189 Chronic kidney disease, unspecified: Secondary | ICD-10-CM | POA: Diagnosis not present

## 2015-12-07 DIAGNOSIS — E7800 Pure hypercholesterolemia, unspecified: Secondary | ICD-10-CM | POA: Diagnosis not present

## 2015-12-09 DIAGNOSIS — E7800 Pure hypercholesterolemia, unspecified: Secondary | ICD-10-CM | POA: Diagnosis not present

## 2015-12-09 DIAGNOSIS — N189 Chronic kidney disease, unspecified: Secondary | ICD-10-CM | POA: Diagnosis not present

## 2015-12-09 DIAGNOSIS — I480 Paroxysmal atrial fibrillation: Secondary | ICD-10-CM | POA: Diagnosis not present

## 2015-12-09 DIAGNOSIS — Z7901 Long term (current) use of anticoagulants: Secondary | ICD-10-CM | POA: Diagnosis not present

## 2016-01-14 DIAGNOSIS — T45515A Adverse effect of anticoagulants, initial encounter: Secondary | ICD-10-CM | POA: Diagnosis not present

## 2016-01-14 DIAGNOSIS — Z7901 Long term (current) use of anticoagulants: Secondary | ICD-10-CM | POA: Diagnosis not present

## 2016-01-14 DIAGNOSIS — I4891 Unspecified atrial fibrillation: Secondary | ICD-10-CM | POA: Diagnosis not present

## 2016-01-14 DIAGNOSIS — S0081XA Abrasion of other part of head, initial encounter: Secondary | ICD-10-CM | POA: Diagnosis not present

## 2016-01-21 DIAGNOSIS — C44222 Squamous cell carcinoma of skin of right ear and external auricular canal: Secondary | ICD-10-CM | POA: Diagnosis not present

## 2016-01-22 DIAGNOSIS — Z23 Encounter for immunization: Secondary | ICD-10-CM | POA: Diagnosis not present

## 2016-03-01 DIAGNOSIS — I10 Essential (primary) hypertension: Secondary | ICD-10-CM | POA: Diagnosis not present

## 2016-03-01 DIAGNOSIS — E039 Hypothyroidism, unspecified: Secondary | ICD-10-CM | POA: Diagnosis not present

## 2016-03-01 DIAGNOSIS — N189 Chronic kidney disease, unspecified: Secondary | ICD-10-CM | POA: Diagnosis not present

## 2016-03-01 DIAGNOSIS — E119 Type 2 diabetes mellitus without complications: Secondary | ICD-10-CM | POA: Diagnosis not present

## 2016-03-01 DIAGNOSIS — I4891 Unspecified atrial fibrillation: Secondary | ICD-10-CM | POA: Diagnosis not present

## 2016-03-01 DIAGNOSIS — E782 Mixed hyperlipidemia: Secondary | ICD-10-CM | POA: Diagnosis not present

## 2016-05-02 DIAGNOSIS — I10 Essential (primary) hypertension: Secondary | ICD-10-CM | POA: Diagnosis not present

## 2016-05-02 DIAGNOSIS — N189 Chronic kidney disease, unspecified: Secondary | ICD-10-CM | POA: Diagnosis not present

## 2016-05-02 DIAGNOSIS — I480 Paroxysmal atrial fibrillation: Secondary | ICD-10-CM | POA: Diagnosis not present

## 2016-05-02 DIAGNOSIS — E7800 Pure hypercholesterolemia, unspecified: Secondary | ICD-10-CM | POA: Diagnosis not present

## 2016-06-28 DIAGNOSIS — E119 Type 2 diabetes mellitus without complications: Secondary | ICD-10-CM | POA: Diagnosis not present

## 2016-06-28 DIAGNOSIS — E039 Hypothyroidism, unspecified: Secondary | ICD-10-CM | POA: Diagnosis not present

## 2016-06-28 DIAGNOSIS — M109 Gout, unspecified: Secondary | ICD-10-CM | POA: Diagnosis not present

## 2016-07-12 DIAGNOSIS — I4891 Unspecified atrial fibrillation: Secondary | ICD-10-CM | POA: Diagnosis not present

## 2016-07-12 DIAGNOSIS — D649 Anemia, unspecified: Secondary | ICD-10-CM | POA: Diagnosis not present

## 2016-07-12 DIAGNOSIS — E039 Hypothyroidism, unspecified: Secondary | ICD-10-CM | POA: Diagnosis not present

## 2016-07-12 DIAGNOSIS — Z9181 History of falling: Secondary | ICD-10-CM | POA: Diagnosis not present

## 2016-07-12 DIAGNOSIS — I10 Essential (primary) hypertension: Secondary | ICD-10-CM | POA: Diagnosis not present

## 2016-07-12 DIAGNOSIS — Z1389 Encounter for screening for other disorder: Secondary | ICD-10-CM | POA: Diagnosis not present

## 2016-07-12 DIAGNOSIS — E782 Mixed hyperlipidemia: Secondary | ICD-10-CM | POA: Diagnosis not present

## 2016-09-13 DIAGNOSIS — J309 Allergic rhinitis, unspecified: Secondary | ICD-10-CM | POA: Diagnosis not present

## 2016-09-13 DIAGNOSIS — Z139 Encounter for screening, unspecified: Secondary | ICD-10-CM | POA: Diagnosis not present

## 2016-09-13 DIAGNOSIS — J45909 Unspecified asthma, uncomplicated: Secondary | ICD-10-CM | POA: Diagnosis not present

## 2016-09-13 DIAGNOSIS — M109 Gout, unspecified: Secondary | ICD-10-CM | POA: Diagnosis not present

## 2016-09-13 DIAGNOSIS — N189 Chronic kidney disease, unspecified: Secondary | ICD-10-CM | POA: Diagnosis not present

## 2016-10-12 DIAGNOSIS — E119 Type 2 diabetes mellitus without complications: Secondary | ICD-10-CM | POA: Diagnosis not present

## 2016-10-12 DIAGNOSIS — E559 Vitamin D deficiency, unspecified: Secondary | ICD-10-CM | POA: Diagnosis not present

## 2016-10-12 DIAGNOSIS — R944 Abnormal results of kidney function studies: Secondary | ICD-10-CM | POA: Diagnosis not present

## 2016-10-12 DIAGNOSIS — I2510 Atherosclerotic heart disease of native coronary artery without angina pectoris: Secondary | ICD-10-CM | POA: Diagnosis not present

## 2016-10-12 DIAGNOSIS — Z79899 Other long term (current) drug therapy: Secondary | ICD-10-CM | POA: Diagnosis not present

## 2016-10-12 DIAGNOSIS — K219 Gastro-esophageal reflux disease without esophagitis: Secondary | ICD-10-CM | POA: Diagnosis not present

## 2016-10-12 DIAGNOSIS — E782 Mixed hyperlipidemia: Secondary | ICD-10-CM | POA: Diagnosis not present

## 2016-10-12 DIAGNOSIS — E039 Hypothyroidism, unspecified: Secondary | ICD-10-CM | POA: Diagnosis not present

## 2016-10-17 ENCOUNTER — Ambulatory Visit: Payer: Self-pay | Admitting: Cardiology

## 2016-12-20 DIAGNOSIS — Z23 Encounter for immunization: Secondary | ICD-10-CM | POA: Diagnosis not present

## 2017-02-22 DIAGNOSIS — N189 Chronic kidney disease, unspecified: Secondary | ICD-10-CM | POA: Diagnosis not present

## 2017-02-22 DIAGNOSIS — K219 Gastro-esophageal reflux disease without esophagitis: Secondary | ICD-10-CM | POA: Diagnosis not present

## 2017-02-22 DIAGNOSIS — E782 Mixed hyperlipidemia: Secondary | ICD-10-CM | POA: Diagnosis not present

## 2017-02-22 DIAGNOSIS — E039 Hypothyroidism, unspecified: Secondary | ICD-10-CM | POA: Diagnosis not present

## 2017-02-22 DIAGNOSIS — E119 Type 2 diabetes mellitus without complications: Secondary | ICD-10-CM | POA: Diagnosis not present

## 2017-02-22 DIAGNOSIS — I4891 Unspecified atrial fibrillation: Secondary | ICD-10-CM | POA: Diagnosis not present

## 2017-03-04 DIAGNOSIS — E039 Hypothyroidism, unspecified: Secondary | ICD-10-CM | POA: Diagnosis not present

## 2017-03-04 DIAGNOSIS — Z7901 Long term (current) use of anticoagulants: Secondary | ICD-10-CM | POA: Diagnosis not present

## 2017-03-04 DIAGNOSIS — R0602 Shortness of breath: Secondary | ICD-10-CM | POA: Diagnosis not present

## 2017-03-04 DIAGNOSIS — Z9981 Dependence on supplemental oxygen: Secondary | ICD-10-CM | POA: Diagnosis not present

## 2017-03-04 DIAGNOSIS — N179 Acute kidney failure, unspecified: Secondary | ICD-10-CM | POA: Diagnosis not present

## 2017-03-04 DIAGNOSIS — R0902 Hypoxemia: Secondary | ICD-10-CM | POA: Diagnosis not present

## 2017-03-04 DIAGNOSIS — J9601 Acute respiratory failure with hypoxia: Secondary | ICD-10-CM | POA: Diagnosis not present

## 2017-03-04 DIAGNOSIS — M109 Gout, unspecified: Secondary | ICD-10-CM | POA: Diagnosis not present

## 2017-03-04 DIAGNOSIS — K219 Gastro-esophageal reflux disease without esophagitis: Secondary | ICD-10-CM | POA: Diagnosis not present

## 2017-03-04 DIAGNOSIS — N400 Enlarged prostate without lower urinary tract symptoms: Secondary | ICD-10-CM | POA: Diagnosis not present

## 2017-03-04 DIAGNOSIS — E785 Hyperlipidemia, unspecified: Secondary | ICD-10-CM | POA: Diagnosis not present

## 2017-03-04 DIAGNOSIS — J690 Pneumonitis due to inhalation of food and vomit: Secondary | ICD-10-CM | POA: Diagnosis not present

## 2017-03-04 DIAGNOSIS — I4891 Unspecified atrial fibrillation: Secondary | ICD-10-CM | POA: Diagnosis not present

## 2017-03-04 DIAGNOSIS — I129 Hypertensive chronic kidney disease with stage 1 through stage 4 chronic kidney disease, or unspecified chronic kidney disease: Secondary | ICD-10-CM | POA: Diagnosis not present

## 2017-03-04 DIAGNOSIS — J189 Pneumonia, unspecified organism: Secondary | ICD-10-CM | POA: Diagnosis not present

## 2017-03-04 DIAGNOSIS — Z79899 Other long term (current) drug therapy: Secondary | ICD-10-CM | POA: Diagnosis not present

## 2017-03-04 DIAGNOSIS — J9602 Acute respiratory failure with hypercapnia: Secondary | ICD-10-CM | POA: Diagnosis not present

## 2017-03-04 DIAGNOSIS — A419 Sepsis, unspecified organism: Secondary | ICD-10-CM | POA: Diagnosis not present

## 2017-03-04 DIAGNOSIS — J181 Lobar pneumonia, unspecified organism: Secondary | ICD-10-CM | POA: Diagnosis not present

## 2017-03-04 DIAGNOSIS — R1310 Dysphagia, unspecified: Secondary | ICD-10-CM | POA: Diagnosis not present

## 2017-03-04 DIAGNOSIS — I10 Essential (primary) hypertension: Secondary | ICD-10-CM | POA: Diagnosis not present

## 2017-03-04 DIAGNOSIS — M1990 Unspecified osteoarthritis, unspecified site: Secondary | ICD-10-CM | POA: Diagnosis not present

## 2017-03-04 DIAGNOSIS — N183 Chronic kidney disease, stage 3 (moderate): Secondary | ICD-10-CM | POA: Diagnosis not present

## 2017-03-04 DIAGNOSIS — Z8673 Personal history of transient ischemic attack (TIA), and cerebral infarction without residual deficits: Secondary | ICD-10-CM | POA: Diagnosis not present

## 2017-03-05 DIAGNOSIS — R0602 Shortness of breath: Secondary | ICD-10-CM | POA: Diagnosis not present

## 2017-03-05 DIAGNOSIS — E785 Hyperlipidemia, unspecified: Secondary | ICD-10-CM | POA: Diagnosis not present

## 2017-03-05 DIAGNOSIS — I4891 Unspecified atrial fibrillation: Secondary | ICD-10-CM | POA: Diagnosis not present

## 2017-03-05 DIAGNOSIS — R0902 Hypoxemia: Secondary | ICD-10-CM | POA: Diagnosis not present

## 2017-03-05 DIAGNOSIS — I10 Essential (primary) hypertension: Secondary | ICD-10-CM | POA: Diagnosis not present

## 2017-03-05 DIAGNOSIS — J189 Pneumonia, unspecified organism: Secondary | ICD-10-CM | POA: Diagnosis not present

## 2017-03-05 DIAGNOSIS — E039 Hypothyroidism, unspecified: Secondary | ICD-10-CM | POA: Diagnosis not present

## 2017-03-05 DIAGNOSIS — K219 Gastro-esophageal reflux disease without esophagitis: Secondary | ICD-10-CM | POA: Diagnosis not present

## 2017-03-08 DIAGNOSIS — E785 Hyperlipidemia, unspecified: Secondary | ICD-10-CM | POA: Diagnosis not present

## 2017-03-08 DIAGNOSIS — I10 Essential (primary) hypertension: Secondary | ICD-10-CM | POA: Diagnosis not present

## 2017-03-08 DIAGNOSIS — R0902 Hypoxemia: Secondary | ICD-10-CM | POA: Diagnosis not present

## 2017-03-08 DIAGNOSIS — K219 Gastro-esophageal reflux disease without esophagitis: Secondary | ICD-10-CM | POA: Diagnosis not present

## 2017-03-08 DIAGNOSIS — E039 Hypothyroidism, unspecified: Secondary | ICD-10-CM | POA: Diagnosis not present

## 2017-03-08 DIAGNOSIS — I4891 Unspecified atrial fibrillation: Secondary | ICD-10-CM | POA: Diagnosis not present

## 2017-03-08 DIAGNOSIS — J189 Pneumonia, unspecified organism: Secondary | ICD-10-CM | POA: Diagnosis not present

## 2017-03-12 DIAGNOSIS — R0602 Shortness of breath: Secondary | ICD-10-CM

## 2017-03-14 DIAGNOSIS — I129 Hypertensive chronic kidney disease with stage 1 through stage 4 chronic kidney disease, or unspecified chronic kidney disease: Secondary | ICD-10-CM | POA: Diagnosis not present

## 2017-03-14 DIAGNOSIS — R1312 Dysphagia, oropharyngeal phase: Secondary | ICD-10-CM | POA: Diagnosis not present

## 2017-03-14 DIAGNOSIS — N183 Chronic kidney disease, stage 3 (moderate): Secondary | ICD-10-CM | POA: Diagnosis not present

## 2017-03-14 DIAGNOSIS — I4891 Unspecified atrial fibrillation: Secondary | ICD-10-CM | POA: Diagnosis not present

## 2017-03-14 DIAGNOSIS — Z9981 Dependence on supplemental oxygen: Secondary | ICD-10-CM | POA: Diagnosis not present

## 2017-03-14 DIAGNOSIS — J181 Lobar pneumonia, unspecified organism: Secondary | ICD-10-CM | POA: Diagnosis not present

## 2017-03-14 DIAGNOSIS — M1991 Primary osteoarthritis, unspecified site: Secondary | ICD-10-CM | POA: Diagnosis not present

## 2017-03-14 DIAGNOSIS — M109 Gout, unspecified: Secondary | ICD-10-CM | POA: Diagnosis not present

## 2017-03-14 DIAGNOSIS — Z7901 Long term (current) use of anticoagulants: Secondary | ICD-10-CM | POA: Diagnosis not present

## 2017-03-15 DIAGNOSIS — Z9981 Dependence on supplemental oxygen: Secondary | ICD-10-CM | POA: Diagnosis not present

## 2017-03-15 DIAGNOSIS — M109 Gout, unspecified: Secondary | ICD-10-CM | POA: Diagnosis not present

## 2017-03-15 DIAGNOSIS — M1991 Primary osteoarthritis, unspecified site: Secondary | ICD-10-CM | POA: Diagnosis not present

## 2017-03-15 DIAGNOSIS — I4891 Unspecified atrial fibrillation: Secondary | ICD-10-CM | POA: Diagnosis not present

## 2017-03-15 DIAGNOSIS — I129 Hypertensive chronic kidney disease with stage 1 through stage 4 chronic kidney disease, or unspecified chronic kidney disease: Secondary | ICD-10-CM | POA: Diagnosis not present

## 2017-03-15 DIAGNOSIS — J181 Lobar pneumonia, unspecified organism: Secondary | ICD-10-CM | POA: Diagnosis not present

## 2017-03-15 DIAGNOSIS — R1312 Dysphagia, oropharyngeal phase: Secondary | ICD-10-CM | POA: Diagnosis not present

## 2017-03-15 DIAGNOSIS — Z7901 Long term (current) use of anticoagulants: Secondary | ICD-10-CM | POA: Diagnosis not present

## 2017-03-15 DIAGNOSIS — N183 Chronic kidney disease, stage 3 (moderate): Secondary | ICD-10-CM | POA: Diagnosis not present

## 2017-03-16 DIAGNOSIS — I129 Hypertensive chronic kidney disease with stage 1 through stage 4 chronic kidney disease, or unspecified chronic kidney disease: Secondary | ICD-10-CM | POA: Diagnosis not present

## 2017-03-16 DIAGNOSIS — N183 Chronic kidney disease, stage 3 (moderate): Secondary | ICD-10-CM | POA: Diagnosis not present

## 2017-03-16 DIAGNOSIS — R1312 Dysphagia, oropharyngeal phase: Secondary | ICD-10-CM | POA: Diagnosis not present

## 2017-03-16 DIAGNOSIS — I4891 Unspecified atrial fibrillation: Secondary | ICD-10-CM | POA: Diagnosis not present

## 2017-03-16 DIAGNOSIS — M109 Gout, unspecified: Secondary | ICD-10-CM | POA: Diagnosis not present

## 2017-03-16 DIAGNOSIS — Z7901 Long term (current) use of anticoagulants: Secondary | ICD-10-CM | POA: Diagnosis not present

## 2017-03-16 DIAGNOSIS — Z9981 Dependence on supplemental oxygen: Secondary | ICD-10-CM | POA: Diagnosis not present

## 2017-03-16 DIAGNOSIS — J181 Lobar pneumonia, unspecified organism: Secondary | ICD-10-CM | POA: Diagnosis not present

## 2017-03-16 DIAGNOSIS — M1991 Primary osteoarthritis, unspecified site: Secondary | ICD-10-CM | POA: Diagnosis not present

## 2017-03-17 DIAGNOSIS — M1991 Primary osteoarthritis, unspecified site: Secondary | ICD-10-CM | POA: Diagnosis not present

## 2017-03-17 DIAGNOSIS — J181 Lobar pneumonia, unspecified organism: Secondary | ICD-10-CM | POA: Diagnosis not present

## 2017-03-17 DIAGNOSIS — Z7901 Long term (current) use of anticoagulants: Secondary | ICD-10-CM | POA: Diagnosis not present

## 2017-03-17 DIAGNOSIS — M109 Gout, unspecified: Secondary | ICD-10-CM | POA: Diagnosis not present

## 2017-03-17 DIAGNOSIS — I129 Hypertensive chronic kidney disease with stage 1 through stage 4 chronic kidney disease, or unspecified chronic kidney disease: Secondary | ICD-10-CM | POA: Diagnosis not present

## 2017-03-17 DIAGNOSIS — Z9981 Dependence on supplemental oxygen: Secondary | ICD-10-CM | POA: Diagnosis not present

## 2017-03-17 DIAGNOSIS — I4891 Unspecified atrial fibrillation: Secondary | ICD-10-CM | POA: Diagnosis not present

## 2017-03-17 DIAGNOSIS — N183 Chronic kidney disease, stage 3 (moderate): Secondary | ICD-10-CM | POA: Diagnosis not present

## 2017-03-17 DIAGNOSIS — R1312 Dysphagia, oropharyngeal phase: Secondary | ICD-10-CM | POA: Diagnosis not present

## 2017-03-22 DIAGNOSIS — Z09 Encounter for follow-up examination after completed treatment for conditions other than malignant neoplasm: Secondary | ICD-10-CM | POA: Diagnosis not present

## 2017-03-22 DIAGNOSIS — Z7901 Long term (current) use of anticoagulants: Secondary | ICD-10-CM | POA: Diagnosis not present

## 2017-03-22 DIAGNOSIS — J181 Lobar pneumonia, unspecified organism: Secondary | ICD-10-CM | POA: Diagnosis not present

## 2017-03-22 DIAGNOSIS — R1312 Dysphagia, oropharyngeal phase: Secondary | ICD-10-CM | POA: Diagnosis not present

## 2017-03-22 DIAGNOSIS — I4891 Unspecified atrial fibrillation: Secondary | ICD-10-CM | POA: Diagnosis not present

## 2017-03-22 DIAGNOSIS — Z9981 Dependence on supplemental oxygen: Secondary | ICD-10-CM | POA: Diagnosis not present

## 2017-03-22 DIAGNOSIS — I129 Hypertensive chronic kidney disease with stage 1 through stage 4 chronic kidney disease, or unspecified chronic kidney disease: Secondary | ICD-10-CM | POA: Diagnosis not present

## 2017-03-22 DIAGNOSIS — M1991 Primary osteoarthritis, unspecified site: Secondary | ICD-10-CM | POA: Diagnosis not present

## 2017-03-22 DIAGNOSIS — J690 Pneumonitis due to inhalation of food and vomit: Secondary | ICD-10-CM | POA: Diagnosis not present

## 2017-03-22 DIAGNOSIS — M109 Gout, unspecified: Secondary | ICD-10-CM | POA: Diagnosis not present

## 2017-03-22 DIAGNOSIS — N183 Chronic kidney disease, stage 3 (moderate): Secondary | ICD-10-CM | POA: Diagnosis not present

## 2017-03-24 DIAGNOSIS — Z7901 Long term (current) use of anticoagulants: Secondary | ICD-10-CM | POA: Diagnosis not present

## 2017-03-24 DIAGNOSIS — M1991 Primary osteoarthritis, unspecified site: Secondary | ICD-10-CM | POA: Diagnosis not present

## 2017-03-24 DIAGNOSIS — Z9981 Dependence on supplemental oxygen: Secondary | ICD-10-CM | POA: Diagnosis not present

## 2017-03-24 DIAGNOSIS — I4891 Unspecified atrial fibrillation: Secondary | ICD-10-CM | POA: Diagnosis not present

## 2017-03-24 DIAGNOSIS — I129 Hypertensive chronic kidney disease with stage 1 through stage 4 chronic kidney disease, or unspecified chronic kidney disease: Secondary | ICD-10-CM | POA: Diagnosis not present

## 2017-03-24 DIAGNOSIS — M109 Gout, unspecified: Secondary | ICD-10-CM | POA: Diagnosis not present

## 2017-03-24 DIAGNOSIS — N183 Chronic kidney disease, stage 3 (moderate): Secondary | ICD-10-CM | POA: Diagnosis not present

## 2017-03-24 DIAGNOSIS — J181 Lobar pneumonia, unspecified organism: Secondary | ICD-10-CM | POA: Diagnosis not present

## 2017-03-24 DIAGNOSIS — R1312 Dysphagia, oropharyngeal phase: Secondary | ICD-10-CM | POA: Diagnosis not present

## 2017-03-26 DIAGNOSIS — M109 Gout, unspecified: Secondary | ICD-10-CM | POA: Diagnosis not present

## 2017-03-26 DIAGNOSIS — I129 Hypertensive chronic kidney disease with stage 1 through stage 4 chronic kidney disease, or unspecified chronic kidney disease: Secondary | ICD-10-CM | POA: Diagnosis not present

## 2017-03-26 DIAGNOSIS — M1991 Primary osteoarthritis, unspecified site: Secondary | ICD-10-CM | POA: Diagnosis not present

## 2017-03-26 DIAGNOSIS — Z7901 Long term (current) use of anticoagulants: Secondary | ICD-10-CM | POA: Diagnosis not present

## 2017-03-26 DIAGNOSIS — R1312 Dysphagia, oropharyngeal phase: Secondary | ICD-10-CM | POA: Diagnosis not present

## 2017-03-26 DIAGNOSIS — J181 Lobar pneumonia, unspecified organism: Secondary | ICD-10-CM | POA: Diagnosis not present

## 2017-03-26 DIAGNOSIS — Z9981 Dependence on supplemental oxygen: Secondary | ICD-10-CM | POA: Diagnosis not present

## 2017-03-26 DIAGNOSIS — N183 Chronic kidney disease, stage 3 (moderate): Secondary | ICD-10-CM | POA: Diagnosis not present

## 2017-03-26 DIAGNOSIS — I4891 Unspecified atrial fibrillation: Secondary | ICD-10-CM | POA: Diagnosis not present

## 2017-03-27 DIAGNOSIS — R1312 Dysphagia, oropharyngeal phase: Secondary | ICD-10-CM | POA: Diagnosis not present

## 2017-03-27 DIAGNOSIS — Z7901 Long term (current) use of anticoagulants: Secondary | ICD-10-CM | POA: Diagnosis not present

## 2017-03-27 DIAGNOSIS — Z9981 Dependence on supplemental oxygen: Secondary | ICD-10-CM | POA: Diagnosis not present

## 2017-03-27 DIAGNOSIS — M1991 Primary osteoarthritis, unspecified site: Secondary | ICD-10-CM | POA: Diagnosis not present

## 2017-03-27 DIAGNOSIS — N183 Chronic kidney disease, stage 3 (moderate): Secondary | ICD-10-CM | POA: Diagnosis not present

## 2017-03-27 DIAGNOSIS — I4891 Unspecified atrial fibrillation: Secondary | ICD-10-CM | POA: Diagnosis not present

## 2017-03-27 DIAGNOSIS — I129 Hypertensive chronic kidney disease with stage 1 through stage 4 chronic kidney disease, or unspecified chronic kidney disease: Secondary | ICD-10-CM | POA: Diagnosis not present

## 2017-03-27 DIAGNOSIS — J181 Lobar pneumonia, unspecified organism: Secondary | ICD-10-CM | POA: Diagnosis not present

## 2017-03-27 DIAGNOSIS — M109 Gout, unspecified: Secondary | ICD-10-CM | POA: Diagnosis not present

## 2017-03-29 DIAGNOSIS — M109 Gout, unspecified: Secondary | ICD-10-CM | POA: Diagnosis not present

## 2017-03-29 DIAGNOSIS — J181 Lobar pneumonia, unspecified organism: Secondary | ICD-10-CM | POA: Diagnosis not present

## 2017-03-29 DIAGNOSIS — Z7901 Long term (current) use of anticoagulants: Secondary | ICD-10-CM | POA: Diagnosis not present

## 2017-03-29 DIAGNOSIS — N183 Chronic kidney disease, stage 3 (moderate): Secondary | ICD-10-CM | POA: Diagnosis not present

## 2017-03-29 DIAGNOSIS — Z9981 Dependence on supplemental oxygen: Secondary | ICD-10-CM | POA: Diagnosis not present

## 2017-03-29 DIAGNOSIS — R1312 Dysphagia, oropharyngeal phase: Secondary | ICD-10-CM | POA: Diagnosis not present

## 2017-03-29 DIAGNOSIS — I129 Hypertensive chronic kidney disease with stage 1 through stage 4 chronic kidney disease, or unspecified chronic kidney disease: Secondary | ICD-10-CM | POA: Diagnosis not present

## 2017-03-29 DIAGNOSIS — I4891 Unspecified atrial fibrillation: Secondary | ICD-10-CM | POA: Diagnosis not present

## 2017-03-29 DIAGNOSIS — M1991 Primary osteoarthritis, unspecified site: Secondary | ICD-10-CM | POA: Diagnosis not present

## 2017-03-31 DIAGNOSIS — I4891 Unspecified atrial fibrillation: Secondary | ICD-10-CM | POA: Diagnosis not present

## 2017-03-31 DIAGNOSIS — J181 Lobar pneumonia, unspecified organism: Secondary | ICD-10-CM | POA: Diagnosis not present

## 2017-03-31 DIAGNOSIS — I129 Hypertensive chronic kidney disease with stage 1 through stage 4 chronic kidney disease, or unspecified chronic kidney disease: Secondary | ICD-10-CM | POA: Diagnosis not present

## 2017-03-31 DIAGNOSIS — Z7901 Long term (current) use of anticoagulants: Secondary | ICD-10-CM | POA: Diagnosis not present

## 2017-03-31 DIAGNOSIS — Z9981 Dependence on supplemental oxygen: Secondary | ICD-10-CM | POA: Diagnosis not present

## 2017-03-31 DIAGNOSIS — M1991 Primary osteoarthritis, unspecified site: Secondary | ICD-10-CM | POA: Diagnosis not present

## 2017-03-31 DIAGNOSIS — M109 Gout, unspecified: Secondary | ICD-10-CM | POA: Diagnosis not present

## 2017-03-31 DIAGNOSIS — N183 Chronic kidney disease, stage 3 (moderate): Secondary | ICD-10-CM | POA: Diagnosis not present

## 2017-03-31 DIAGNOSIS — R1312 Dysphagia, oropharyngeal phase: Secondary | ICD-10-CM | POA: Diagnosis not present

## 2017-04-03 DIAGNOSIS — J181 Lobar pneumonia, unspecified organism: Secondary | ICD-10-CM | POA: Diagnosis not present

## 2017-04-03 DIAGNOSIS — M1991 Primary osteoarthritis, unspecified site: Secondary | ICD-10-CM | POA: Diagnosis not present

## 2017-04-03 DIAGNOSIS — Z9981 Dependence on supplemental oxygen: Secondary | ICD-10-CM | POA: Diagnosis not present

## 2017-04-03 DIAGNOSIS — Z7901 Long term (current) use of anticoagulants: Secondary | ICD-10-CM | POA: Diagnosis not present

## 2017-04-03 DIAGNOSIS — R1312 Dysphagia, oropharyngeal phase: Secondary | ICD-10-CM | POA: Diagnosis not present

## 2017-04-03 DIAGNOSIS — N183 Chronic kidney disease, stage 3 (moderate): Secondary | ICD-10-CM | POA: Diagnosis not present

## 2017-04-03 DIAGNOSIS — I4891 Unspecified atrial fibrillation: Secondary | ICD-10-CM | POA: Diagnosis not present

## 2017-04-03 DIAGNOSIS — I129 Hypertensive chronic kidney disease with stage 1 through stage 4 chronic kidney disease, or unspecified chronic kidney disease: Secondary | ICD-10-CM | POA: Diagnosis not present

## 2017-04-03 DIAGNOSIS — M109 Gout, unspecified: Secondary | ICD-10-CM | POA: Diagnosis not present

## 2017-04-05 DIAGNOSIS — M1991 Primary osteoarthritis, unspecified site: Secondary | ICD-10-CM | POA: Diagnosis not present

## 2017-04-05 DIAGNOSIS — I129 Hypertensive chronic kidney disease with stage 1 through stage 4 chronic kidney disease, or unspecified chronic kidney disease: Secondary | ICD-10-CM | POA: Diagnosis not present

## 2017-04-05 DIAGNOSIS — M109 Gout, unspecified: Secondary | ICD-10-CM | POA: Diagnosis not present

## 2017-04-05 DIAGNOSIS — R1312 Dysphagia, oropharyngeal phase: Secondary | ICD-10-CM | POA: Diagnosis not present

## 2017-04-05 DIAGNOSIS — Z9981 Dependence on supplemental oxygen: Secondary | ICD-10-CM | POA: Diagnosis not present

## 2017-04-05 DIAGNOSIS — N183 Chronic kidney disease, stage 3 (moderate): Secondary | ICD-10-CM | POA: Diagnosis not present

## 2017-04-05 DIAGNOSIS — Z7901 Long term (current) use of anticoagulants: Secondary | ICD-10-CM | POA: Diagnosis not present

## 2017-04-05 DIAGNOSIS — I4891 Unspecified atrial fibrillation: Secondary | ICD-10-CM | POA: Diagnosis not present

## 2017-04-05 DIAGNOSIS — J181 Lobar pneumonia, unspecified organism: Secondary | ICD-10-CM | POA: Diagnosis not present

## 2017-04-10 DIAGNOSIS — I4891 Unspecified atrial fibrillation: Secondary | ICD-10-CM | POA: Diagnosis not present

## 2017-04-10 DIAGNOSIS — M109 Gout, unspecified: Secondary | ICD-10-CM | POA: Diagnosis not present

## 2017-04-10 DIAGNOSIS — I129 Hypertensive chronic kidney disease with stage 1 through stage 4 chronic kidney disease, or unspecified chronic kidney disease: Secondary | ICD-10-CM | POA: Diagnosis not present

## 2017-04-10 DIAGNOSIS — M1991 Primary osteoarthritis, unspecified site: Secondary | ICD-10-CM | POA: Diagnosis not present

## 2017-04-10 DIAGNOSIS — R1312 Dysphagia, oropharyngeal phase: Secondary | ICD-10-CM | POA: Diagnosis not present

## 2017-04-10 DIAGNOSIS — N183 Chronic kidney disease, stage 3 (moderate): Secondary | ICD-10-CM | POA: Diagnosis not present

## 2017-04-10 DIAGNOSIS — Z9981 Dependence on supplemental oxygen: Secondary | ICD-10-CM | POA: Diagnosis not present

## 2017-04-10 DIAGNOSIS — J181 Lobar pneumonia, unspecified organism: Secondary | ICD-10-CM | POA: Diagnosis not present

## 2017-04-10 DIAGNOSIS — Z7901 Long term (current) use of anticoagulants: Secondary | ICD-10-CM | POA: Diagnosis not present

## 2017-04-12 DIAGNOSIS — Z9981 Dependence on supplemental oxygen: Secondary | ICD-10-CM | POA: Diagnosis not present

## 2017-04-12 DIAGNOSIS — I4891 Unspecified atrial fibrillation: Secondary | ICD-10-CM | POA: Diagnosis not present

## 2017-04-12 DIAGNOSIS — M1991 Primary osteoarthritis, unspecified site: Secondary | ICD-10-CM | POA: Diagnosis not present

## 2017-04-12 DIAGNOSIS — I129 Hypertensive chronic kidney disease with stage 1 through stage 4 chronic kidney disease, or unspecified chronic kidney disease: Secondary | ICD-10-CM | POA: Diagnosis not present

## 2017-04-12 DIAGNOSIS — Z7901 Long term (current) use of anticoagulants: Secondary | ICD-10-CM | POA: Diagnosis not present

## 2017-04-12 DIAGNOSIS — R1312 Dysphagia, oropharyngeal phase: Secondary | ICD-10-CM | POA: Diagnosis not present

## 2017-04-12 DIAGNOSIS — N183 Chronic kidney disease, stage 3 (moderate): Secondary | ICD-10-CM | POA: Diagnosis not present

## 2017-04-12 DIAGNOSIS — J181 Lobar pneumonia, unspecified organism: Secondary | ICD-10-CM | POA: Diagnosis not present

## 2017-04-12 DIAGNOSIS — M109 Gout, unspecified: Secondary | ICD-10-CM | POA: Diagnosis not present

## 2017-05-17 DIAGNOSIS — N189 Chronic kidney disease, unspecified: Secondary | ICD-10-CM | POA: Diagnosis not present

## 2017-05-17 DIAGNOSIS — E559 Vitamin D deficiency, unspecified: Secondary | ICD-10-CM | POA: Diagnosis not present

## 2017-05-17 DIAGNOSIS — M109 Gout, unspecified: Secondary | ICD-10-CM | POA: Diagnosis not present

## 2017-05-17 DIAGNOSIS — E039 Hypothyroidism, unspecified: Secondary | ICD-10-CM | POA: Diagnosis not present

## 2017-05-17 DIAGNOSIS — I10 Essential (primary) hypertension: Secondary | ICD-10-CM | POA: Diagnosis not present

## 2017-09-05 DIAGNOSIS — L814 Other melanin hyperpigmentation: Secondary | ICD-10-CM | POA: Diagnosis not present

## 2017-09-05 DIAGNOSIS — L821 Other seborrheic keratosis: Secondary | ICD-10-CM | POA: Diagnosis not present

## 2017-09-05 DIAGNOSIS — C44319 Basal cell carcinoma of skin of other parts of face: Secondary | ICD-10-CM | POA: Diagnosis not present

## 2017-09-14 DIAGNOSIS — Z1339 Encounter for screening examination for other mental health and behavioral disorders: Secondary | ICD-10-CM | POA: Diagnosis not present

## 2017-09-14 DIAGNOSIS — N189 Chronic kidney disease, unspecified: Secondary | ICD-10-CM | POA: Diagnosis not present

## 2017-09-14 DIAGNOSIS — E559 Vitamin D deficiency, unspecified: Secondary | ICD-10-CM | POA: Diagnosis not present

## 2017-09-14 DIAGNOSIS — Z9181 History of falling: Secondary | ICD-10-CM | POA: Diagnosis not present

## 2017-09-14 DIAGNOSIS — I10 Essential (primary) hypertension: Secondary | ICD-10-CM | POA: Diagnosis not present

## 2017-09-20 DIAGNOSIS — C44329 Squamous cell carcinoma of skin of other parts of face: Secondary | ICD-10-CM | POA: Diagnosis not present

## 2017-12-14 DIAGNOSIS — L570 Actinic keratosis: Secondary | ICD-10-CM | POA: Diagnosis not present

## 2017-12-14 DIAGNOSIS — C44329 Squamous cell carcinoma of skin of other parts of face: Secondary | ICD-10-CM | POA: Diagnosis not present

## 2017-12-19 DIAGNOSIS — E559 Vitamin D deficiency, unspecified: Secondary | ICD-10-CM | POA: Diagnosis not present

## 2017-12-19 DIAGNOSIS — E039 Hypothyroidism, unspecified: Secondary | ICD-10-CM | POA: Diagnosis not present

## 2017-12-19 DIAGNOSIS — Z23 Encounter for immunization: Secondary | ICD-10-CM | POA: Diagnosis not present

## 2017-12-19 DIAGNOSIS — M109 Gout, unspecified: Secondary | ICD-10-CM | POA: Diagnosis not present

## 2017-12-19 DIAGNOSIS — R739 Hyperglycemia, unspecified: Secondary | ICD-10-CM | POA: Diagnosis not present

## 2017-12-19 DIAGNOSIS — I10 Essential (primary) hypertension: Secondary | ICD-10-CM | POA: Diagnosis not present

## 2017-12-19 DIAGNOSIS — E782 Mixed hyperlipidemia: Secondary | ICD-10-CM | POA: Diagnosis not present

## 2018-01-31 DIAGNOSIS — C44329 Squamous cell carcinoma of skin of other parts of face: Secondary | ICD-10-CM | POA: Diagnosis not present

## 2018-04-03 DIAGNOSIS — C44329 Squamous cell carcinoma of skin of other parts of face: Secondary | ICD-10-CM | POA: Diagnosis not present

## 2018-04-08 DIAGNOSIS — J209 Acute bronchitis, unspecified: Secondary | ICD-10-CM | POA: Diagnosis not present

## 2018-04-24 ENCOUNTER — Encounter: Payer: Self-pay | Admitting: Internal Medicine

## 2018-04-24 DIAGNOSIS — E782 Mixed hyperlipidemia: Secondary | ICD-10-CM | POA: Diagnosis not present

## 2018-04-24 DIAGNOSIS — M109 Gout, unspecified: Secondary | ICD-10-CM | POA: Diagnosis not present

## 2018-04-24 DIAGNOSIS — I10 Essential (primary) hypertension: Secondary | ICD-10-CM | POA: Diagnosis not present

## 2018-04-24 DIAGNOSIS — Z79899 Other long term (current) drug therapy: Secondary | ICD-10-CM | POA: Diagnosis not present

## 2018-04-24 DIAGNOSIS — I2510 Atherosclerotic heart disease of native coronary artery without angina pectoris: Secondary | ICD-10-CM | POA: Diagnosis not present

## 2018-04-24 DIAGNOSIS — Z139 Encounter for screening, unspecified: Secondary | ICD-10-CM | POA: Diagnosis not present

## 2018-05-22 DIAGNOSIS — E785 Hyperlipidemia, unspecified: Secondary | ICD-10-CM | POA: Diagnosis not present

## 2018-05-22 DIAGNOSIS — Z79899 Other long term (current) drug therapy: Secondary | ICD-10-CM | POA: Diagnosis not present

## 2018-05-22 DIAGNOSIS — J309 Allergic rhinitis, unspecified: Secondary | ICD-10-CM | POA: Diagnosis not present

## 2018-05-22 DIAGNOSIS — I10 Essential (primary) hypertension: Secondary | ICD-10-CM | POA: Diagnosis not present

## 2018-05-22 DIAGNOSIS — Z0000 Encounter for general adult medical examination without abnormal findings: Secondary | ICD-10-CM | POA: Diagnosis not present

## 2018-05-22 DIAGNOSIS — Z1211 Encounter for screening for malignant neoplasm of colon: Secondary | ICD-10-CM | POA: Diagnosis not present

## 2018-06-14 DIAGNOSIS — C44311 Basal cell carcinoma of skin of nose: Secondary | ICD-10-CM | POA: Diagnosis not present

## 2018-06-21 DIAGNOSIS — C44311 Basal cell carcinoma of skin of nose: Secondary | ICD-10-CM | POA: Diagnosis not present

## 2018-07-31 DIAGNOSIS — C44329 Squamous cell carcinoma of skin of other parts of face: Secondary | ICD-10-CM | POA: Diagnosis not present

## 2018-08-06 DIAGNOSIS — C44329 Squamous cell carcinoma of skin of other parts of face: Secondary | ICD-10-CM | POA: Diagnosis not present

## 2018-08-21 DIAGNOSIS — E039 Hypothyroidism, unspecified: Secondary | ICD-10-CM | POA: Diagnosis not present

## 2018-08-21 DIAGNOSIS — I4891 Unspecified atrial fibrillation: Secondary | ICD-10-CM | POA: Diagnosis not present

## 2018-08-21 DIAGNOSIS — I10 Essential (primary) hypertension: Secondary | ICD-10-CM | POA: Diagnosis not present

## 2018-08-23 ENCOUNTER — Telehealth: Payer: Self-pay | Admitting: *Deleted

## 2018-08-23 ENCOUNTER — Telehealth: Payer: Self-pay | Admitting: Cardiology

## 2018-08-23 NOTE — Telephone Encounter (Signed)
Called patient per Dr. Geraldo Pitter to schedule Televisit per Dr Rica Records. Lm to call office to schedule. [facility]

## 2018-08-23 NOTE — Telephone Encounter (Signed)
YOUR CARDIOLOGY TEAM HAS ARRANGED FOR AN E-VISIT FOR YOUR APPOINTMENT - PLEASE REVIEW IMPORTANT INFORMATION BELOW SEVERAL DAYS PRIOR TO YOUR APPOINTMENT  Due to the recent COVID-19 pandemic, we are transitioning in-person office visits to tele-medicine visits in an effort to decrease unnecessary exposure to our patients, their families, and staff. These visits are billed to your insurance just like a normal visit is. We also encourage you to sign up for MyChart if you have not already done so. You will need a smartphone if possible. For patients that do not have this, we can still complete the visit using a regular telephone but do prefer a smartphone to enable video when possible. You may have a family member that lives with you that can help. If possible, we also ask that you have a blood pressure cuff and scale at home to measure your blood pressure, heart rate and weight prior to your scheduled appointment. Patients with clinical needs that need an in-person evaluation and testing will still be able to come to the office if absolutely necessary. If you have any questions, feel free to call our office.     YOUR PROVIDER WILL BE USING THE FOLLOWING PLATFORM TO COMPLETE YOUR VISIT: Staff: Please delete this text and fill in MyChart/[email or web address]  . IF USING MYCHART - How to Download the MyChart App to Your SmartPhone   - If Apple, go to App Store and type in MyChart in the search bar and download the app. If Android, ask patient to go to Google Play Store and type in MyChart in the search bar and download the app. The app is free but as with any other app downloads, your phone may require you to verify saved payment information or Apple/Android password.  - You will need to then log into the app with your MyChart username and password, and select [facility] as your healthcare provider to link the account.  - When it is time for your visit, go to the MyChart app, find appointments, and click Begin  Video Visit. Be sure to Select Allow for your device to access the Microphone and Camera for your visit. You will then be connected, and your provider will be with you shortly.  **If you have any issues connecting or need assistance, please contact MyChart service desk [phone] ([phone])**  **If using a computer, in order to ensure the best quality for your visit, you will need to use either of the following Internet Browsers: Google Chrome or Microsoft Edge**  . IF USING DOXIMITY or [email or web address].ME - The staff will give you instructions on receiving your link to join the meeting the day of your visit.      2-3 DAYS BEFORE YOUR APPOINTMENT  You will receive a telephone call from one of our [facility] team members - your caller ID may say "Unknown caller." If this is a video visit, we will walk you through how to get the video launched on your phone. We will remind you check your blood pressure, heart rate and weight prior to your scheduled appointment. If you have an Apple Watch or Kardia, please upload any pertinent ECG strips the day before or morning of your appointment to MyChart. Our staff will also make sure you have reviewed the consent and agree to move forward with your scheduled tele-health visit.     THE DAY OF YOUR APPOINTMENT  Approximately 15 minutes prior to your scheduled appointment, you will receive a telephone call from one of [facility] team -   your caller ID may say "Unknown caller."  Our staff will confirm medications, vital signs for the day and any symptoms you may be experiencing. Please have this information available prior to the time of visit start. It may also be helpful for you to have a pad of paper and pen handy for any instructions given during your visit. They will also walk you through joining the smartphone meeting if this is a video visit.    CONSENT FOR TELE-HEALTH VISIT - PLEASE REVIEW  I hereby voluntarily request, consent and authorize [facility] and  its employed or contracted physicians, physician assistants, nurse practitioners or other licensed health care professionals (the Practitioner), to provide me with telemedicine health care services (the "Services") as deemed necessary by the treating Practitioner. I acknowledge and consent to receive the Services by the Practitioner via telemedicine. I understand that the telemedicine visit will involve communicating with the Practitioner through live audiovisual communication technology and the disclosure of certain medical information by electronic transmission. I acknowledge that I have been given the opportunity to request an in-person assessment or other available alternative prior to the telemedicine visit and am voluntarily participating in the telemedicine visit.  I understand that I have the right to withhold or withdraw my consent to the use of telemedicine in the course of my care at any time, without affecting my right to future care or treatment, and that the Practitioner or I may terminate the telemedicine visit at any time. I understand that I have the right to inspect all information obtained and/or recorded in the course of the telemedicine visit and may receive copies of available information for a reasonable fee.  I understand that some of the potential risks of receiving the Services via telemedicine include:  Marland Kitchen Delay or interruption in medical evaluation due to technological equipment failure or disruption; . Information transmitted may not be sufficient (e.g. poor resolution of images) to allow for appropriate medical decision making by the Practitioner; and/or  . In rare instances, security protocols could fail, causing a breach of personal health information.  Furthermore, I acknowledge that it is my responsibility to provide information about my medical history, conditions and care that is complete and accurate to the best of my ability. I acknowledge that Practitioner's advice,  recommendations, and/or decision may be based on factors not within their control, such as incomplete or inaccurate data provided by me or distortions of diagnostic images or specimens that may result from electronic transmissions. I understand that the practice of medicine is not an exact science and that Practitioner makes no warranties or guarantees regarding treatment outcomes. I acknowledge that I will receive a copy of this consent concurrently upon execution via email to the email address I last provided but may also request a printed copy by calling the office of [facility].    I understand that my insurance will be billed for this visit.   I have read or had this consent read to me. . I understand the contents of this consent, which adequately explains the benefits and risks of the Services being provided via telemedicine.  . I have been provided ample opportunity to ask questions regarding this consent and the Services and have had my questions answered to my satisfaction. . I give my informed consent for the services to be provided through the use of telemedicine in my medical care  By participating in this telemedicine visit I agree to the above.Pt gives consent to virtual visit.

## 2018-08-27 ENCOUNTER — Telehealth (INDEPENDENT_AMBULATORY_CARE_PROVIDER_SITE_OTHER): Payer: Medicare Other | Admitting: Cardiology

## 2018-08-27 ENCOUNTER — Other Ambulatory Visit: Payer: Self-pay

## 2018-08-27 ENCOUNTER — Encounter: Payer: Self-pay | Admitting: Cardiology

## 2018-08-27 VITALS — Ht 68.0 in | Wt 187.0 lb

## 2018-08-27 DIAGNOSIS — I10 Essential (primary) hypertension: Secondary | ICD-10-CM | POA: Diagnosis not present

## 2018-08-27 DIAGNOSIS — Z1329 Encounter for screening for other suspected endocrine disorder: Secondary | ICD-10-CM

## 2018-08-27 DIAGNOSIS — E7800 Pure hypercholesterolemia, unspecified: Secondary | ICD-10-CM

## 2018-08-27 DIAGNOSIS — I480 Paroxysmal atrial fibrillation: Secondary | ICD-10-CM

## 2018-08-27 NOTE — Progress Notes (Signed)
Virtual Visit via Video Note   This visit type was conducted due to national recommendations for restrictions regarding the COVID-19 Pandemic (e.g. social distancing) in an effort to limit this patient's exposure and mitigate transmission in our community.  Due to his co-morbid illnesses, this patient is at least at moderate risk for complications without adequate follow up.  This format is felt to be most appropriate for this patient at this time.  All issues noted in this document were discussed and addressed.  A limited physical exam was performed with this format.  Please refer to the patient's chart for his consent to telehealth for [facility].   Date:  08/27/2018   ID:  Robert Chapman, DOB 09-11-1933, MRN [id number]  Patient Location: Home Provider Location: Home  PCP:  Nicholos Johns, MD  Cardiologist:  No primary care provider on file.  Electrophysiologist:  None   Evaluation Performed:  Follow-Up Visit  Chief Complaint: Paroxysmal atrial fibrillation  History of Present Illness:    Robert Chapman is a 85 y.o. male with past medical history of essential hypertension, renal insufficiency and paroxysmal atrial fibrillation.  His daughter is helping him with the video visit today.  Patient denies any problems at this time and takes care of activities of daily living.  No chest pain orthopnea or PND.  He has not noticed any blood from his stools or any acid such issues.  At the time of my evaluation, the patient is alert awake oriented and in no distress.  The patient does not have symptoms concerning for COVID-19 infection (fever, chills, cough, or new shortness of breath).    Past Medical History:  Diagnosis Date  . ACUTE KIDNEY FAILURE UNSPECIFIED 06/08/2010   Qualifier: Diagnosis of  By: Chase Caller MD, Murali    . Essential hypertension 10/08/2014  . MYOCARDIAL INFARCTION 06/08/2010   Qualifier: History of  By: Charma Igo    . PAF (paroxysmal atrial fibrillation) ([place])  10/08/2014   Past Surgical History:  Procedure Laterality Date  . heart surgery       Current Meds  Medication Sig  . amLODipine (NORVASC) 10 MG tablet Take 10 mg by mouth daily.  Marland Kitchen atenolol (TENORMIN) 25 MG tablet Take 25 mg by mouth 2 (two) times daily.  . febuxostat (ULORIC) 40 MG tablet Take 40 mg by mouth daily.  . fenofibrate 160 MG tablet Take 160 mg by mouth daily.  . furosemide (LASIX) 20 MG tablet Take 10 mg by mouth.  . levothyroxine (SYNTHROID) 100 MCG tablet Take 100 mcg by mouth daily before breakfast.  . potassium chloride (KLOR-CON) 20 MEQ packet Take 10 mEq by mouth as needed.  . pravastatin (PRAVACHOL) 40 MG tablet Take 40 mg by mouth daily.  . Rivaroxaban (XARELTO) 15 MG TABS tablet Take 15 mg by mouth at bedtime.  . tamsulosin (FLOMAX) 0.4 MG CAPS capsule Take 0.4 mg by mouth daily.     Allergies:   Patient has no known allergies.   Social History   Tobacco Use  . Smoking status: Not on file  Substance Use Topics  . Alcohol use: Not on file  . Drug use: Not on file     Family Hx: The patient's family history is not on file.  ROS:   Please see the history of present illness.    As mentioned above All other systems reviewed and are negative.   Prior CV studies:   The following studies were reviewed today:  I reviewed records  from primary care physician's office  Labs/Other Tests and Data Reviewed:    EKG:  EKG was reviewed from primary care and was found to have nonspecific ST-T changes and was largely unremarkable heart rate was fine  Recent Labs: No results found for requested labs within last 8760 hours.   Recent Lipid Panel No results found for: CHOL, TRIG, HDL, CHOLHDL, LDLCALC, LDLDIRECT  Wt Readings from Last 3 Encounters:  08/27/18 187 lb (84.8 kg)  06/08/10 203 lb 2.1 oz (92.1 kg)     Objective:    Vital Signs:  Ht 5\' 8"  (1.727 m)   Wt 187 lb (84.8 kg)   BMI 28.43 kg/m    VITAL SIGNS:  reviewed  ASSESSMENT & PLAN:    1.  Paroxysmal atrial fibrillation:I discussed with the patient atrial fibrillation, disease process. Management and therapy including rate and rhythm control, anticoagulation benefits and potential risks were discussed extensively with the patient. Patient had multiple questions which were answered to patient's satisfaction. 2. Essential hypertension: Blood pressure stable 3. Mixed dyslipidemia: Patient is on statin therapy and will come back in the next few days for blood work to her office he will get a Chem-7, CBC, liver lipid check and TSH 4. Patient will be seen in follow-up appointment in 6 months or earlier if the patient has any concerns 5. We will refill his medications after his blood work and we made his daughter aware of this.  COVID-19 Education: The signs and symptoms of COVID-19 were discussed with the patient and how to seek care for testing (follow up with PCP or arrange E-visit).  The importance of social distancing was discussed today.  Time:   Today, I have spent 15 minutes with the patient with telehealth technology discussing the above problems.     Medication Adjustments/Labs and Tests Ordered: Current medicines are reviewed at length with the patient today.  Concerns regarding medicines are outlined above.   Tests Ordered: No orders of the defined types were placed in this encounter.   Medication Changes: No orders of the defined types were placed in this encounter.   Disposition:  Follow up in 4 month(s)  Signed, Jenean Lindau, MD  08/27/2018 4:36 PM    [facility]

## 2018-08-27 NOTE — Patient Instructions (Signed)
Medication Instructions:  Your physician recommends that you continue on your current medications as directed. Please refer to the Current Medication list given to you today.  If you need a refill on your cardiac medications before your next appointment, please call your pharmacy.   Lab work: YOU will need to come in fasting this week for BMP, TSH, liver,lipid, and CBC to be drawn  If you have labs (blood work) drawn today and your tests are completely normal, you will receive your results only by: Marland Kitchen MyChart Message (if you have MyChart) OR . A paper copy in the mail If you have any lab test that is abnormal or we need to change your treatment, we will call you to review the results.  Testing/Procedures: NONE  Follow-Up: At [facility], you and your health needs are our priority.  As part of our continuing mission to provide you with exceptional heart care, we have created designated Provider Care Teams.  These Care Teams include your primary Cardiologist (physician) and Advanced Practice Providers (APPs -  Physician Assistants and Nurse Practitioners) who all work together to provide you with the care you need, when you need it. You will need a follow up appointment in 6 months.

## 2018-08-27 NOTE — Addendum Note (Signed)
Addended by: Beckey Rutter on: 08/27/2018 04:59 PM   Modules accepted: Orders

## 2018-09-03 DIAGNOSIS — E559 Vitamin D deficiency, unspecified: Secondary | ICD-10-CM | POA: Diagnosis not present

## 2018-09-03 DIAGNOSIS — Z79899 Other long term (current) drug therapy: Secondary | ICD-10-CM | POA: Diagnosis not present

## 2018-09-03 DIAGNOSIS — M109 Gout, unspecified: Secondary | ICD-10-CM | POA: Diagnosis not present

## 2018-09-03 DIAGNOSIS — E782 Mixed hyperlipidemia: Secondary | ICD-10-CM | POA: Diagnosis not present

## 2018-09-03 DIAGNOSIS — E039 Hypothyroidism, unspecified: Secondary | ICD-10-CM | POA: Diagnosis not present

## 2018-09-04 DIAGNOSIS — C44329 Squamous cell carcinoma of skin of other parts of face: Secondary | ICD-10-CM | POA: Diagnosis not present

## 2018-09-05 ENCOUNTER — Other Ambulatory Visit: Payer: Self-pay

## 2018-09-05 MED ORDER — ATENOLOL 25 MG PO TABS: 25.0000 mg | ORAL_TABLET | Freq: Two times a day (BID) | ORAL | 1 refills | Status: DC

## 2018-09-05 MED ORDER — RIVAROXABAN 15 MG PO TABS: 15.0000 mg | ORAL_TABLET | Freq: Every day | ORAL | 1 refills | Status: DC

## 2018-09-05 MED ORDER — FENOFIBRATE 160 MG PO TABS: 160.0000 mg | ORAL_TABLET | Freq: Every day | ORAL | 1 refills | Status: DC

## 2018-09-05 MED ORDER — PRAVASTATIN SODIUM 40 MG PO TABS: 40.0000 mg | ORAL_TABLET | Freq: Every day | ORAL | 1 refills | Status: DC

## 2018-12-17 DIAGNOSIS — E782 Mixed hyperlipidemia: Secondary | ICD-10-CM | POA: Diagnosis not present

## 2018-12-17 DIAGNOSIS — I4891 Unspecified atrial fibrillation: Secondary | ICD-10-CM | POA: Diagnosis not present

## 2018-12-17 DIAGNOSIS — I10 Essential (primary) hypertension: Secondary | ICD-10-CM | POA: Diagnosis not present

## 2018-12-17 DIAGNOSIS — E559 Vitamin D deficiency, unspecified: Secondary | ICD-10-CM | POA: Diagnosis not present

## 2018-12-17 DIAGNOSIS — Z79899 Other long term (current) drug therapy: Secondary | ICD-10-CM | POA: Diagnosis not present

## 2019-01-09 DIAGNOSIS — R739 Hyperglycemia, unspecified: Secondary | ICD-10-CM | POA: Diagnosis not present

## 2019-01-09 DIAGNOSIS — Z79899 Other long term (current) drug therapy: Secondary | ICD-10-CM | POA: Diagnosis not present

## 2019-01-09 DIAGNOSIS — Z23 Encounter for immunization: Secondary | ICD-10-CM | POA: Diagnosis not present

## 2019-01-09 DIAGNOSIS — E559 Vitamin D deficiency, unspecified: Secondary | ICD-10-CM | POA: Diagnosis not present

## 2019-01-09 DIAGNOSIS — M109 Gout, unspecified: Secondary | ICD-10-CM | POA: Diagnosis not present

## 2019-01-15 DIAGNOSIS — L570 Actinic keratosis: Secondary | ICD-10-CM | POA: Diagnosis not present

## 2019-02-13 ENCOUNTER — Other Ambulatory Visit: Payer: Self-pay | Admitting: Cardiology

## 2019-02-25 ENCOUNTER — Other Ambulatory Visit: Payer: Self-pay | Admitting: Cardiology

## 2019-02-25 NOTE — Telephone Encounter (Signed)
Fenofibrate refill sent to [facility], requires follow-up visit for additional refills.

## 2019-03-18 DIAGNOSIS — Z79899 Other long term (current) drug therapy: Secondary | ICD-10-CM | POA: Diagnosis not present

## 2019-03-18 DIAGNOSIS — I10 Essential (primary) hypertension: Secondary | ICD-10-CM | POA: Diagnosis not present

## 2019-03-18 DIAGNOSIS — E782 Mixed hyperlipidemia: Secondary | ICD-10-CM | POA: Diagnosis not present

## 2019-03-18 DIAGNOSIS — E559 Vitamin D deficiency, unspecified: Secondary | ICD-10-CM | POA: Diagnosis not present

## 2019-03-18 DIAGNOSIS — I4891 Unspecified atrial fibrillation: Secondary | ICD-10-CM | POA: Diagnosis not present

## 2019-05-21 DIAGNOSIS — C44329 Squamous cell carcinoma of skin of other parts of face: Secondary | ICD-10-CM | POA: Diagnosis not present

## 2019-05-21 DIAGNOSIS — L570 Actinic keratosis: Secondary | ICD-10-CM | POA: Diagnosis not present

## 2019-06-08 DIAGNOSIS — I4891 Unspecified atrial fibrillation: Secondary | ICD-10-CM | POA: Diagnosis not present

## 2019-06-08 DIAGNOSIS — Z8673 Personal history of transient ischemic attack (TIA), and cerebral infarction without residual deficits: Secondary | ICD-10-CM | POA: Diagnosis not present

## 2019-06-08 DIAGNOSIS — R040 Epistaxis: Secondary | ICD-10-CM | POA: Diagnosis not present

## 2019-06-08 DIAGNOSIS — I252 Old myocardial infarction: Secondary | ICD-10-CM | POA: Diagnosis not present

## 2019-06-08 DIAGNOSIS — I10 Essential (primary) hypertension: Secondary | ICD-10-CM | POA: Diagnosis not present

## 2019-06-08 DIAGNOSIS — Z951 Presence of aortocoronary bypass graft: Secondary | ICD-10-CM | POA: Diagnosis not present

## 2019-06-13 DIAGNOSIS — J342 Deviated nasal septum: Secondary | ICD-10-CM | POA: Diagnosis not present

## 2019-06-13 DIAGNOSIS — Z7901 Long term (current) use of anticoagulants: Secondary | ICD-10-CM | POA: Diagnosis not present

## 2019-06-13 DIAGNOSIS — J310 Chronic rhinitis: Secondary | ICD-10-CM | POA: Diagnosis not present

## 2019-06-13 DIAGNOSIS — R040 Epistaxis: Secondary | ICD-10-CM | POA: Diagnosis not present

## 2019-06-17 ENCOUNTER — Other Ambulatory Visit: Payer: Self-pay | Admitting: Cardiology

## 2019-06-17 DIAGNOSIS — Z09 Encounter for follow-up examination after completed treatment for conditions other than malignant neoplasm: Secondary | ICD-10-CM | POA: Diagnosis not present

## 2019-06-17 DIAGNOSIS — E782 Mixed hyperlipidemia: Secondary | ICD-10-CM | POA: Diagnosis not present

## 2019-06-17 DIAGNOSIS — E559 Vitamin D deficiency, unspecified: Secondary | ICD-10-CM | POA: Diagnosis not present

## 2019-06-17 DIAGNOSIS — I10 Essential (primary) hypertension: Secondary | ICD-10-CM | POA: Diagnosis not present

## 2019-06-17 DIAGNOSIS — I4891 Unspecified atrial fibrillation: Secondary | ICD-10-CM | POA: Diagnosis not present

## 2019-07-16 DIAGNOSIS — L570 Actinic keratosis: Secondary | ICD-10-CM | POA: Diagnosis not present

## 2019-07-16 DIAGNOSIS — D0439 Carcinoma in situ of skin of other parts of face: Secondary | ICD-10-CM | POA: Diagnosis not present

## 2019-08-13 ENCOUNTER — Other Ambulatory Visit: Payer: Self-pay | Admitting: Cardiology

## 2019-09-05 DIAGNOSIS — I10 Essential (primary) hypertension: Secondary | ICD-10-CM | POA: Diagnosis not present

## 2019-09-05 DIAGNOSIS — Z79899 Other long term (current) drug therapy: Secondary | ICD-10-CM | POA: Diagnosis not present

## 2019-09-05 DIAGNOSIS — E559 Vitamin D deficiency, unspecified: Secondary | ICD-10-CM | POA: Diagnosis not present

## 2019-09-05 DIAGNOSIS — M1A00X1 Idiopathic chronic gout, unspecified site, with tophus (tophi): Secondary | ICD-10-CM | POA: Diagnosis not present

## 2019-09-05 DIAGNOSIS — R739 Hyperglycemia, unspecified: Secondary | ICD-10-CM | POA: Diagnosis not present

## 2019-09-05 DIAGNOSIS — E039 Hypothyroidism, unspecified: Secondary | ICD-10-CM | POA: Diagnosis not present

## 2019-09-05 DIAGNOSIS — E782 Mixed hyperlipidemia: Secondary | ICD-10-CM | POA: Diagnosis not present

## 2019-09-07 ENCOUNTER — Other Ambulatory Visit: Payer: Self-pay | Admitting: Cardiology

## 2019-09-13 ENCOUNTER — Other Ambulatory Visit: Payer: Self-pay | Admitting: Cardiology

## 2019-09-16 ENCOUNTER — Ambulatory Visit: Payer: Medicare Other | Admitting: Podiatry

## 2019-09-16 ENCOUNTER — Ambulatory Visit: Payer: Self-pay | Admitting: Podiatry

## 2019-09-16 ENCOUNTER — Other Ambulatory Visit: Payer: Self-pay

## 2019-09-16 DIAGNOSIS — I878 Other specified disorders of veins: Secondary | ICD-10-CM

## 2019-09-16 DIAGNOSIS — N183 Chronic kidney disease, stage 3 unspecified: Secondary | ICD-10-CM

## 2019-09-16 DIAGNOSIS — B351 Tinea unguium: Secondary | ICD-10-CM | POA: Diagnosis not present

## 2019-09-16 DIAGNOSIS — I872 Venous insufficiency (chronic) (peripheral): Secondary | ICD-10-CM | POA: Diagnosis not present

## 2019-09-17 DIAGNOSIS — L570 Actinic keratosis: Secondary | ICD-10-CM | POA: Diagnosis not present

## 2019-09-17 DIAGNOSIS — D0439 Carcinoma in situ of skin of other parts of face: Secondary | ICD-10-CM | POA: Diagnosis not present

## 2019-09-17 DIAGNOSIS — L821 Other seborrheic keratosis: Secondary | ICD-10-CM | POA: Diagnosis not present

## 2019-09-29 NOTE — Progress Notes (Signed)
  Subjective:  Patient ID: Robert Chapman, male    DOB: 15-Dec-1933,  MRN: [id number]  Chief Complaint  Patient presents with  . Foot Problem    pt referred here for peripheral vasculat dx with stasis dermatitis -pt denies any foot pain     86 y.o. male presents with the above complaint. History confirmed with patient.   Objective:  Physical Exam: warm, good capillary refill, thickened dystrophic nails bilateral, pitting edema bilaterally with atrophic leg changes. DP pulses palpable, PT pulses palpable and protective sensation intact Left Foot: normal exam, no swelling, tenderness, instability; ligaments intact, full range of motion of all ankle/foot joints  Right Foot: normal exam, no swelling, tenderness, instability; ligaments intact, full range of motion of all ankle/foot joints   No images are attached to the encounter.  Assessment:   1. Onychomycosis   2. Stage 3 chronic kidney disease, unspecified whether stage 3a or 3b CKD   3. Venous (peripheral) insufficiency   4. Venous stasis   5. Venous stasis dermatitis of both lower extremities    Plan:  Patient was evaluated and treated and all questions answered.  Venous Insufficiency -Dispense tubigrips  Onychomycosis, CDK -Nails debrided  Procedure: Nail Debridement Rationale: Patient meets criteria for routine foot care due to CDK Type of Debridement: manual, sharp debridement. Instrumentation: Nail nipper, rotary burr. Number of Nails: 10  No follow-ups on file.

## 2019-11-28 ENCOUNTER — Other Ambulatory Visit: Payer: Self-pay | Admitting: Cardiology

## 2019-12-16 DIAGNOSIS — I4891 Unspecified atrial fibrillation: Secondary | ICD-10-CM | POA: Diagnosis not present

## 2019-12-16 DIAGNOSIS — E559 Vitamin D deficiency, unspecified: Secondary | ICD-10-CM | POA: Diagnosis not present

## 2019-12-16 DIAGNOSIS — I10 Essential (primary) hypertension: Secondary | ICD-10-CM | POA: Diagnosis not present

## 2019-12-16 DIAGNOSIS — E782 Mixed hyperlipidemia: Secondary | ICD-10-CM | POA: Diagnosis not present

## 2019-12-16 DIAGNOSIS — E039 Hypothyroidism, unspecified: Secondary | ICD-10-CM | POA: Diagnosis not present

## 2019-12-19 ENCOUNTER — Other Ambulatory Visit: Payer: Self-pay

## 2019-12-19 ENCOUNTER — Ambulatory Visit: Payer: Medicare Other | Admitting: Podiatry

## 2019-12-19 DIAGNOSIS — B351 Tinea unguium: Secondary | ICD-10-CM

## 2019-12-19 DIAGNOSIS — D0439 Carcinoma in situ of skin of other parts of face: Secondary | ICD-10-CM | POA: Diagnosis not present

## 2019-12-19 DIAGNOSIS — N183 Chronic kidney disease, stage 3 unspecified: Secondary | ICD-10-CM

## 2019-12-19 DIAGNOSIS — L570 Actinic keratosis: Secondary | ICD-10-CM | POA: Diagnosis not present

## 2019-12-19 NOTE — Progress Notes (Signed)
°  Subjective:  Patient ID: Robert Chapman, male    DOB: Mar 05, 1934,  MRN: [id number]  No chief complaint on file.   86 y.o. male presents for at risk foot care. Denies new issues.  Objective:  Physical Exam: warm, good capillary refill, thickened dystrophic nails bilateral, pitting edema bilaterally with atrophic leg changes. DP pulses palpable, PT pulses palpable and protective sensation intact Left Foot: normal exam, no swelling, tenderness, instability; ligaments intact, full range of motion of all ankle/foot joints  Right Foot: normal exam, no swelling, tenderness, instability; ligaments intact, full range of motion of all ankle/foot joints   No images are attached to the encounter.  Assessment:   1. Onychomycosis   2. Stage 3 chronic kidney disease, unspecified whether stage 3a or 3b CKD    Plan:  Patient was evaluated and treated and all questions answered.  Venous Insufficiency -Dispense tubigrips  Onychomycosis, CDK -Nails debrided   Procedure: Nail Debridement Rationale: Patient meets criteria for routine foot care due to CDK Type of Debridement: manual, sharp debridement. Instrumentation: Nail nipper, rotary burr. Number of Nails: 10    No follow-ups on file.

## 2019-12-22 ENCOUNTER — Other Ambulatory Visit: Payer: Self-pay | Admitting: Cardiology

## 2020-01-14 ENCOUNTER — Other Ambulatory Visit: Payer: Self-pay | Admitting: Cardiology

## 2020-01-14 NOTE — Telephone Encounter (Signed)
Refill sent to pharmacy.   

## 2020-01-17 ENCOUNTER — Other Ambulatory Visit: Payer: Self-pay | Admitting: Cardiology

## 2020-02-01 ENCOUNTER — Other Ambulatory Visit: Payer: Self-pay | Admitting: Cardiology

## 2020-02-03 ENCOUNTER — Other Ambulatory Visit: Payer: Self-pay | Admitting: Cardiology

## 2020-02-04 ENCOUNTER — Other Ambulatory Visit: Payer: Self-pay

## 2020-02-05 ENCOUNTER — Ambulatory Visit: Payer: Medicare Other | Admitting: Cardiology

## 2020-02-05 ENCOUNTER — Other Ambulatory Visit: Payer: Self-pay

## 2020-02-05 ENCOUNTER — Encounter: Payer: Self-pay | Admitting: Cardiology

## 2020-02-05 VITALS — BP 124/70 | HR 95 | Ht 67.0 in | Wt 178.2 lb

## 2020-02-05 DIAGNOSIS — N189 Chronic kidney disease, unspecified: Secondary | ICD-10-CM | POA: Diagnosis not present

## 2020-02-05 DIAGNOSIS — E7800 Pure hypercholesterolemia, unspecified: Secondary | ICD-10-CM | POA: Diagnosis not present

## 2020-02-05 DIAGNOSIS — Z23 Encounter for immunization: Secondary | ICD-10-CM | POA: Diagnosis not present

## 2020-02-05 DIAGNOSIS — I480 Paroxysmal atrial fibrillation: Secondary | ICD-10-CM

## 2020-02-05 DIAGNOSIS — I10 Essential (primary) hypertension: Secondary | ICD-10-CM | POA: Diagnosis not present

## 2020-02-05 NOTE — Patient Instructions (Signed)
Medication Instructions:  No medication changes. *If you need a refill on your cardiac medications before your next appointment, please call your pharmacy*   Lab Work: Your physician recommends that you have labs done in the office today. Your test included  basic metabolic panel, complete blood count, Vitamin D, TSH, liver function and lipids.  If you have labs (blood work) drawn today and your tests are completely normal, you will receive your results only by: Marland Kitchen MyChart Message (if you have MyChart) OR . A paper copy in the mail If you have any lab test that is abnormal or we need to change your treatment, we will call you to review the results.   Testing/Procedures: None ordered   Follow-Up: At [facility], you and your health needs are our priority.  As part of our continuing mission to provide you with exceptional heart care, we have created designated Provider Care Teams.  These Care Teams include your primary Cardiologist (physician) and Advanced Practice Providers (APPs -  Physician Assistants and Nurse Practitioners) who all work together to provide you with the care you need, when you need it.  We recommend signing up for the patient portal called "MyChart".  Sign up information is provided on this After Visit Summary.  MyChart is used to connect with patients for Virtual Visits (Telemedicine).  Patients are able to view lab/test results, encounter notes, upcoming appointments, etc.  Non-urgent messages can be sent to your provider as well.   To learn more about what you can do with MyChart, go to [email or web address].    Your next appointment:   6 month(s)  The format for your next appointment:   In Person  Provider:   Jyl Heinz, MD   Other Instructions NA

## 2020-02-05 NOTE — Progress Notes (Signed)
Cardiology Office Note:    Date:  02/05/2020   ID:  Robert Chapman, DOB 24-Jun-1933, MRN [id number]  PCP:  Nicholos Johns, MD  Cardiologist:  Jenean Lindau, MD   Referring MD: Nicholos Johns, MD    ASSESSMENT:    1. Essential hypertension   2. PAF (paroxysmal atrial fibrillation) (HCC)   3. Pure hypercholesterolemia   4. Chronic renal impairment, unspecified CKD stage    PLAN:    In order of problems listed above:  1. Primary prevention stressed with the patient.  Importance of compliance with diet medication stressed and he vocalized understanding. 2. Essential hypertension: Blood pressure stable and diet was emphasized.  Salt intake issues and lifestyle modification was urged. 3. Mixed dyslipidemia: On statin therapy.  He will have blood work today as he is fasting. 4. Renal insufficiency: Stable.  We will do blood work today. 5. I reviewed blood work from [employer] and discussed with him at length this was done in June. 6. Patient will be seen in follow-up appointment in 6 months or earlier if the patient has any concerns    Medication Adjustments/Labs and Tests Ordered: Current medicines are reviewed at length with the patient today.  Concerns regarding medicines are outlined above.  No orders of the defined types were placed in this encounter.  No orders of the defined types were placed in this encounter.    No chief complaint on file.    History of Present Illness:    Robert Chapman is a 86 y.o. male.  Patient has past medical history of essential hypertension paroxysmal atrial fibrillation and chronic renal insufficiency.  He denies any problems at this time and takes care of activities of daily living.  No chest pain orthopnea or PND.  He leads a sedentary lifestyle.  He started accompanies him for this visit.  At the time of my evaluation, the patient is alert awake oriented and in no distress.  Past Medical History:  Diagnosis Date  . ACUTE KIDNEY FAILURE  UNSPECIFIED 06/08/2010   Qualifier: Diagnosis of  By: Chase Caller MD, Murali    . ACUTE RESPIRATORY FAILURE 06/08/2010   Qualifier: Diagnosis of  By: Chase Caller MD, Murali    . ANEMIA IN CHRONIC KIDNEY DISEASE 06/08/2010   Qualifier: Diagnosis of  By: Chase Caller MD, Murali    . CRI (chronic renal insufficiency) 10/08/2014  . Essential hypertension 10/08/2014  . MYOCARDIAL INFARCTION 06/08/2010   Qualifier: History of  By: Charma Igo    . PAF (paroxysmal atrial fibrillation) ([place]) 10/08/2014  . Pure hypercholesterolemia 10/08/2014    Past Surgical History:  Procedure Laterality Date  . heart surgery      Current Medications: Current Meds  Medication Sig  . amLODipine (NORVASC) 10 MG tablet Take 10 mg by mouth daily.  Marland Kitchen atenolol (TENORMIN) 25 MG tablet TAKE 1 TABLET BY MOUTH  TWICE DAILY  . fenofibrate 160 MG tablet Take 1 tablet (160 mg total) by mouth daily. Patient needs an appointment for further refills, last warning  . furosemide (LASIX) 20 MG tablet Take 10 mg by mouth as needed.   Marland Kitchen levothyroxine (SYNTHROID) 100 MCG tablet Take 100 mcg by mouth daily before breakfast.  . potassium chloride (KLOR-CON) 20 MEQ packet Take 10 mEq by mouth as needed.  . pravastatin (PRAVACHOL) 40 MG tablet TAKE 1 TABLET BY MOUTH  DAILY  . tamsulosin (FLOMAX) 0.4 MG CAPS capsule Take 0.4 mg by mouth daily.  . Vitamin D, Ergocalciferol, (DRISDOL) 1.25 MG (  50000 UNIT) CAPS capsule Take 50,000 Units by mouth once a week.  Alveda Reasons 15 MG TABS tablet TAKE 1 TABLET BY MOUTH AT  BEDTIME     Allergies:   Patient has no known allergies.   Social History   Socioeconomic History  . Marital status: Married    Spouse name: Not on file  . Number of children: Not on file  . Years of education: Not on file  . Highest education level: Not on file  Occupational History  . Not on file  Tobacco Use  . Smoking status: Not on file  Substance and Sexual Activity  . Alcohol use: Not on file  . Drug use: Not on file    . Sexual activity: Not on file  Other Topics Concern  . Not on file  Social History Narrative  . Not on file   Social Determinants of Health   Financial Resource Strain:   . Difficulty of Paying Living Expenses: Not on file  Food Insecurity:   . Worried About Charity fundraiser in the Last Year: Not on file  . Ran Out of Food in the Last Year: Not on file  Transportation Needs:   . Lack of Transportation (Medical): Not on file  . Lack of Transportation (Non-Medical): Not on file  Physical Activity:   . Days of Exercise per Week: Not on file  . Minutes of Exercise per Session: Not on file  Stress:   . Feeling of Stress : Not on file  Social Connections:   . Frequency of Communication with Friends and Family: Not on file  . Frequency of Social Gatherings with Friends and Family: Not on file  . Attends Religious Services: Not on file  . Active Member of Clubs or Organizations: Not on file  . Attends Archivist Meetings: Not on file  . Marital Status: Not on file     Family History: The patient's family history includes Cancer in his father; Hypertension in his mother.  ROS:   Please see the history of present illness.    All other systems reviewed and are negative.  EKGs/Labs/Other Studies Reviewed:    The following studies were reviewed today: EKG reveals atrial flutter/fibrillation with well-controlled ventricular rate.   Recent Labs: No results found for requested labs within last 8760 hours.  Recent Lipid Panel No results found for: CHOL, TRIG, HDL, CHOLHDL, VLDL, LDLCALC, LDLDIRECT  Physical Exam:    VS:  BP 124/70   Pulse 95   Ht 5\' 7"  (1.702 m)   Wt 178 lb 3.2 oz (80.8 kg)   SpO2 95%   BMI 27.91 kg/m     Wt Readings from Last 3 Encounters:  02/05/20 178 lb 3.2 oz (80.8 kg)  08/27/18 187 lb (84.8 kg)  06/08/10 203 lb 2.1 oz (92.1 kg)     GEN: Patient is in no acute distress HEENT: Normal NECK: No JVD; No carotid bruits LYMPHATICS: No  lymphadenopathy CARDIAC: Hear sounds regular, 2/6 systolic murmur at the apex. RESPIRATORY:  Clear to auscultation without rales, wheezing or rhonchi  ABDOMEN: Soft, non-tender, non-distended MUSCULOSKELETAL:  No edema; No deformity  SKIN: Warm and dry NEUROLOGIC:  Alert and oriented x 3 PSYCHIATRIC:  Normal affect   Signed, Jenean Lindau, MD  02/05/2020 8:20 AM    [place]

## 2020-02-06 LAB — BASIC METABOLIC PANEL
BUN/Creatinine Ratio: 18 (ref 10–24)
BUN: 35 mg/dL — ABNORMAL HIGH (ref 8–27)
CO2: 25 mmol/L (ref 20–29)
Calcium: 10 mg/dL (ref 8.6–10.2)
Chloride: 99 mmol/L (ref 96–106)
Creatinine, Ser: 1.93 mg/dL — ABNORMAL HIGH (ref 0.76–1.27)
GFR calc Af Amer: 36 mL/min/{1.73_m2} — ABNORMAL LOW (ref >59)
GFR calc non Af Amer: 31 mL/min/{1.73_m2} — ABNORMAL LOW (ref >59)
Glucose: 98 mg/dL (ref 65–99)
Potassium: 4.1 mmol/L (ref 3.5–5.2)
Sodium: 137 mmol/L (ref 134–144)

## 2020-02-06 LAB — CBC WITH DIFFERENTIAL/PLATELET
Basophils Absolute: 0.1 10*3/uL (ref 0.0–0.2)
Basos: 1 %
EOS (ABSOLUTE): 0.4 10*3/uL (ref 0.0–0.4)
Eos: 5 %
Hematocrit: 40 % (ref 37.5–51.0)
Hemoglobin: 13.2 g/dL (ref 13.0–17.7)
Immature Grans (Abs): 0 10*3/uL (ref 0.0–0.1)
Immature Granulocytes: 0 %
Lymphocytes Absolute: 1.7 10*3/uL (ref 0.7–3.1)
Lymphs: 21 %
MCH: 31 pg (ref 26.6–33.0)
MCHC: 33 g/dL (ref 31.5–35.7)
MCV: 94 fL (ref 79–97)
Monocytes Absolute: 0.8 10*3/uL (ref 0.1–0.9)
Monocytes: 9 %
Neutrophils Absolute: 5.2 10*3/uL (ref 1.4–7.0)
Neutrophils: 64 %
Platelets: 237 10*3/uL (ref 150–450)
RBC: 4.26 x10E6/uL (ref 4.14–5.80)
RDW: 12.9 % (ref 11.6–15.4)
WBC: 8.2 10*3/uL (ref 3.4–10.8)

## 2020-02-06 LAB — HEPATIC FUNCTION PANEL
ALT: 11 IU/L (ref 0–44)
AST: 28 IU/L (ref 0–40)
Albumin: 4.2 g/dL (ref 3.6–4.6)
Alkaline Phosphatase: 45 IU/L (ref 44–121)
Bilirubin Total: 0.7 mg/dL (ref 0.0–1.2)
Bilirubin, Direct: 0.29 mg/dL (ref 0.00–0.40)
Total Protein: 7.9 g/dL (ref 6.0–8.5)

## 2020-02-06 LAB — TSH: TSH: 0.584 u[IU]/mL (ref 0.450–4.500)

## 2020-02-06 LAB — LIPID PANEL
Chol/HDL Ratio: 4.4 ratio (ref 0.0–5.0)
Cholesterol, Total: 162 mg/dL (ref 100–199)
HDL: 37 mg/dL — ABNORMAL LOW (ref >39)
LDL Chol Calc (NIH): 104 mg/dL — ABNORMAL HIGH (ref 0–99)
Triglycerides: 115 mg/dL (ref 0–149)
VLDL Cholesterol Cal: 21 mg/dL (ref 5–40)

## 2020-02-06 LAB — VITAMIN D 25 HYDROXY (VIT D DEFICIENCY, FRACTURES): Vit D, 25-Hydroxy: 54.9 ng/mL (ref 30.0–100.0)

## 2020-02-08 ENCOUNTER — Other Ambulatory Visit: Payer: Self-pay | Admitting: Cardiology

## 2020-03-06 DIAGNOSIS — E782 Mixed hyperlipidemia: Secondary | ICD-10-CM | POA: Diagnosis not present

## 2020-03-06 DIAGNOSIS — E039 Hypothyroidism, unspecified: Secondary | ICD-10-CM | POA: Diagnosis not present

## 2020-03-06 DIAGNOSIS — I10 Essential (primary) hypertension: Secondary | ICD-10-CM | POA: Diagnosis not present

## 2020-03-06 DIAGNOSIS — E559 Vitamin D deficiency, unspecified: Secondary | ICD-10-CM | POA: Diagnosis not present

## 2020-03-06 DIAGNOSIS — R739 Hyperglycemia, unspecified: Secondary | ICD-10-CM | POA: Diagnosis not present

## 2020-03-19 ENCOUNTER — Ambulatory Visit: Payer: Medicare Other | Admitting: Podiatry

## 2020-03-19 ENCOUNTER — Other Ambulatory Visit: Payer: Self-pay | Admitting: Cardiology

## 2020-03-23 ENCOUNTER — Ambulatory Visit: Payer: Medicare Other | Admitting: Podiatry

## 2020-03-23 ENCOUNTER — Other Ambulatory Visit: Payer: Self-pay

## 2020-03-23 ENCOUNTER — Ambulatory Visit (INDEPENDENT_AMBULATORY_CARE_PROVIDER_SITE_OTHER): Payer: Medicare Other

## 2020-03-23 ENCOUNTER — Encounter: Payer: Self-pay | Admitting: Podiatry

## 2020-03-23 DIAGNOSIS — L97512 Non-pressure chronic ulcer of other part of right foot with fat layer exposed: Secondary | ICD-10-CM

## 2020-03-23 DIAGNOSIS — L821 Other seborrheic keratosis: Secondary | ICD-10-CM | POA: Diagnosis not present

## 2020-03-23 DIAGNOSIS — M8580 Other specified disorders of bone density and structure, unspecified site: Secondary | ICD-10-CM

## 2020-03-23 DIAGNOSIS — M1A0710 Idiopathic chronic gout, right ankle and foot, without tophus (tophi): Secondary | ICD-10-CM | POA: Diagnosis not present

## 2020-03-23 DIAGNOSIS — N183 Chronic kidney disease, stage 3 unspecified: Secondary | ICD-10-CM | POA: Diagnosis not present

## 2020-03-23 DIAGNOSIS — B351 Tinea unguium: Secondary | ICD-10-CM

## 2020-03-23 DIAGNOSIS — M109 Gout, unspecified: Secondary | ICD-10-CM

## 2020-03-23 DIAGNOSIS — C44529 Squamous cell carcinoma of skin of other part of trunk: Secondary | ICD-10-CM | POA: Diagnosis not present

## 2020-03-23 NOTE — Progress Notes (Signed)
  Subjective:  Patient ID: Robert Chapman, male    DOB: 02-Jan-1934,  MRN: [id number]  Chief Complaint  Patient presents with  . Nail Problem    Trim nails   . Foot Problem    I have a spot on the right 5th toe that is draining    86 y.o. male presents for wound care. Hx confirmed with patient. Has new area of right foot that is draining. Objective:  Physical Exam: Wound Location: right 5th toe Wound Base: gout crystals, measures 0.3x0.3 Peri-wound: intact Exudate: Scant/small amount chalky exudate ruptured blister with healthy pinkish tissue beneath    Radiographs:  X-ray of the right foot: no erosive changes at 5th toe but with medial marginal erosions 1st met, lateral 2nd met Assessment:   1. Chronic gout of right foot, unspecified cause   2. Ulcer of toe, right, with fat layer exposed ([place])   3. Stage 3 chronic kidney disease, unspecified whether stage 3a or 3b CKD ([place])   4. Onychomycosis   5. Bone erosion determined by x-ray    Plan:  Patient was evaluated and treated and all questions answered.  Ulcerated gout right 5th toe -XR reviewed with patient -Dressing applied consisting of medihoney -Offload ulcer with surgical shoe -Surgical shoe dispensed  Procedure: Selective Debridement of Wound Rationale: Removal of devitalized tissue from the wound to promote healing.  Pre-Debridement Wound Measurements: 0.3 cm x 0.3 cm x 0.1 cm  Post-Debridement Wound Measurements: same as pre-debridement. Type of Debridement: sharp selective Tissue Removed: Devitalized soft-tissue Dressing: Dry, sterile, compression dressing. Disposition: Patient tolerated procedure well. Patient to return in 2 week for follow-up.  Onychomycosis, CKD3 -Debrided as below  Procedure: Nail Debridement Type of Debridement: manual, sharp debridement. Instrumentation: Nail nipper, rotary burr. Number of Nails: 10  Return in about 2 weeks (around 04/06/2020) for Wound Debridement Procedure.

## 2020-03-31 DIAGNOSIS — D045 Carcinoma in situ of skin of trunk: Secondary | ICD-10-CM | POA: Diagnosis not present

## 2020-04-09 ENCOUNTER — Ambulatory Visit: Payer: Medicare Other | Admitting: Podiatry

## 2020-04-09 ENCOUNTER — Other Ambulatory Visit: Payer: Self-pay

## 2020-04-09 ENCOUNTER — Encounter: Payer: Self-pay | Admitting: Podiatry

## 2020-04-09 DIAGNOSIS — L97512 Non-pressure chronic ulcer of other part of right foot with fat layer exposed: Secondary | ICD-10-CM

## 2020-04-09 DIAGNOSIS — M1A0710 Idiopathic chronic gout, right ankle and foot, without tophus (tophi): Secondary | ICD-10-CM | POA: Diagnosis not present

## 2020-04-09 NOTE — Progress Notes (Signed)
  Subjective:  Patient ID: Robert Chapman, male    DOB: 10-14-1933,  MRN: [id number]  Chief Complaint  Patient presents with  . Foot Ulcer    The 5th toe on the right is doing a lot better and there has been little draining    87 y.o. male presents for wound care. Hx confirmed with patient. Has been soaking his foot. Objective:  Physical Exam: Wound Location: right 5th toe Wound Base: granular Peri-wound: intact Exudate: none ruptured blister with healthy pinkish tissue beneath Assessment:   1. Chronic gout of right foot, unspecified cause   2. Ulcer of toe, right, with fat layer exposed ([place])    Plan:  Patient was evaluated and treated and all questions answered.  Ulcerated gout right 5th toe -Much improved. Continue soaks. -Minimal debridement indicated today. -Dressed with medihoney and band-aid.  Return in about 1 month (around 05/10/2020).

## 2020-05-11 ENCOUNTER — Ambulatory Visit: Payer: Medicare Other | Admitting: Podiatry

## 2020-05-18 ENCOUNTER — Ambulatory Visit: Payer: Medicare Other | Admitting: Podiatry

## 2020-05-18 ENCOUNTER — Encounter: Payer: Self-pay | Admitting: Podiatry

## 2020-05-18 ENCOUNTER — Other Ambulatory Visit: Payer: Self-pay

## 2020-05-18 DIAGNOSIS — L02611 Cutaneous abscess of right foot: Secondary | ICD-10-CM

## 2020-05-25 NOTE — Progress Notes (Signed)
  Subjective:  Patient ID: Robert Chapman, male    DOB: 1933-04-07,  MRN: [id number]  Chief Complaint  Patient presents with  . Foot Ulcer    The 5th toe on the right is doing better and does get a little sore and there is not any draining   87 y.o. male presents for wound care. Hx confirmed with patient.  Objective:  Physical Exam: Wound Location: right 5th toe Wound Base: granular Peri-wound: intact Exudate: Chalky Abscess with slight draining areas noted Assessment:   1. Abscess, toe, right    Plan:  Patient was evaluated and treated and all questions answered.  Ulcerated gout right 5th toe with abscess -The toe was anesthetized with 3 cc lidocaine 1% plain in order to allow for incision and drainage of the abscess the wound selectively debrided and chalky exudate was extruded.  Dressed with antibiotic ointment and Band-Aid.  Okay to dress the area daily with antibiotic ointment and Band-Aid -Would consider further debridement should issues persist  Procedure: Incision abscess right fifth toe Anesthesia: Lidocaine 1% plain; 31mL Instrumentation: 15 blade Technique: Following sterile skin prep and anesthesia the lesion was incised with a 15 blade to allow for evacuation of chalky exudate. Dressing: Dry, sterile, compression dressing. Disposition: Patient tolerated procedure well. Patient to return in 2 week for follow-up.  No follow-ups on file.

## 2020-06-01 ENCOUNTER — Ambulatory Visit (INDEPENDENT_AMBULATORY_CARE_PROVIDER_SITE_OTHER): Payer: Medicare Other | Admitting: Podiatry

## 2020-06-01 ENCOUNTER — Other Ambulatory Visit: Payer: Self-pay

## 2020-06-01 DIAGNOSIS — M1A0710 Idiopathic chronic gout, right ankle and foot, without tophus (tophi): Secondary | ICD-10-CM | POA: Diagnosis not present

## 2020-06-01 DIAGNOSIS — L02611 Cutaneous abscess of right foot: Secondary | ICD-10-CM | POA: Diagnosis not present

## 2020-06-01 NOTE — Progress Notes (Signed)
  Subjective:  Patient ID: Robert Chapman, male    DOB: Aug 16, 1933,  MRN: [id number]  Chief Complaint  Patient presents with  . Ulcer    F/U Rt ulcer check -pt deniees N/V/F/Ch -per pt it's doing better." - no pain/redness/swelling/draiange Tx: none   87 y.o. male presents for wound care. Hx confirmed with patient.  Objective:  Physical Exam: Wound Location: right 5th toe Wound Base: immature epithelium Peri-wound: intact Exudate: none Assessment:   1. Abscess, toe, right   2. Chronic gout of right foot, unspecified cause    Plan:  Patient was evaluated and treated and all questions answered.  Ulcerated gout right 5th toe with abscess -No need for aggressive debridement today. Appears essentially healed but is high risk of recurrence. -Continue abx ointment and band-aid. -F/u in 1 month for recheck and at risk foot care.  No follow-ups on file.

## 2020-06-15 DIAGNOSIS — E559 Vitamin D deficiency, unspecified: Secondary | ICD-10-CM | POA: Diagnosis not present

## 2020-06-15 DIAGNOSIS — R739 Hyperglycemia, unspecified: Secondary | ICD-10-CM | POA: Diagnosis not present

## 2020-06-15 DIAGNOSIS — J309 Allergic rhinitis, unspecified: Secondary | ICD-10-CM | POA: Diagnosis not present

## 2020-06-15 DIAGNOSIS — E782 Mixed hyperlipidemia: Secondary | ICD-10-CM | POA: Diagnosis not present

## 2020-06-15 DIAGNOSIS — D649 Anemia, unspecified: Secondary | ICD-10-CM | POA: Diagnosis not present

## 2020-06-15 DIAGNOSIS — I4891 Unspecified atrial fibrillation: Secondary | ICD-10-CM | POA: Diagnosis not present

## 2020-06-15 DIAGNOSIS — R600 Localized edema: Secondary | ICD-10-CM | POA: Diagnosis not present

## 2020-06-15 DIAGNOSIS — N289 Disorder of kidney and ureter, unspecified: Secondary | ICD-10-CM | POA: Diagnosis not present

## 2020-06-15 DIAGNOSIS — E039 Hypothyroidism, unspecified: Secondary | ICD-10-CM | POA: Diagnosis not present

## 2020-06-15 DIAGNOSIS — I10 Essential (primary) hypertension: Secondary | ICD-10-CM | POA: Diagnosis not present

## 2020-06-16 ENCOUNTER — Telehealth: Payer: Self-pay | Admitting: Cardiology

## 2020-06-16 NOTE — Telephone Encounter (Signed)
Family states that Dr. Rica Records wants pt to have labs done at his next appointment.

## 2020-06-16 NOTE — Telephone Encounter (Signed)
Robert Chapman is requesting orders for the patient to have lab work completed on the same day as 08/14/20 appointment with Dr. Geraldo Pitter.

## 2020-07-02 ENCOUNTER — Other Ambulatory Visit: Payer: Self-pay

## 2020-07-02 ENCOUNTER — Ambulatory Visit: Payer: Medicare Other | Admitting: Podiatry

## 2020-07-02 DIAGNOSIS — L02611 Cutaneous abscess of right foot: Secondary | ICD-10-CM

## 2020-07-02 DIAGNOSIS — B351 Tinea unguium: Secondary | ICD-10-CM

## 2020-07-02 DIAGNOSIS — N183 Chronic kidney disease, stage 3 unspecified: Secondary | ICD-10-CM

## 2020-07-02 DIAGNOSIS — M1A0710 Idiopathic chronic gout, right ankle and foot, without tophus (tophi): Secondary | ICD-10-CM

## 2020-07-02 NOTE — Progress Notes (Signed)
  Subjective:  Patient ID: VALMORE ARABIE, male    DOB: 11-20-1933,  MRN: [id number]  Chief Complaint  Patient presents with  . debride    F/U Rt 5th toe ulcer check -pt staets looks better and feels better -less sore -per pt no redness/swelling/drianage   87 y.o. male presents for wound care. Hx confirmed with patient.  Objective:  Physical Exam: Wound Location: right 5th toe Wound Base: granular Peri-wound: intact Exudate: Chalky   Nails thickened, dystrophic. Poorly palpable pedal pulses. Assessment:   1. Abscess, toe, right   2. Chronic gout of right foot, unspecified cause   3. Onychomycosis   4. Stage 3 chronic kidney disease, unspecified whether stage 3a or 3b CKD ([place])    Plan:  Patient was evaluated and treated and all questions answered.  Ulcerated gout right 5th toe with abscess -Wound continues to improve. Minimally debrided today. Still with chalky exutate  Onychomycosis and CKD -Patient has CKD3 with a qualifying condition for at risk foot care.  Procedure: Nail Debridement Type of Debridement: manual, sharp debridement. Instrumentation: Nail nipper, rotary burr. Number of Nails: 10  Return in about 6 weeks (around 08/13/2020) for Wound Care.

## 2020-07-23 DIAGNOSIS — L720 Epidermal cyst: Secondary | ICD-10-CM | POA: Diagnosis not present

## 2020-07-23 DIAGNOSIS — L570 Actinic keratosis: Secondary | ICD-10-CM | POA: Diagnosis not present

## 2020-07-23 DIAGNOSIS — D045 Carcinoma in situ of skin of trunk: Secondary | ICD-10-CM | POA: Diagnosis not present

## 2020-07-24 DIAGNOSIS — L720 Epidermal cyst: Secondary | ICD-10-CM | POA: Diagnosis not present

## 2020-08-13 ENCOUNTER — Other Ambulatory Visit: Payer: Self-pay

## 2020-08-13 ENCOUNTER — Ambulatory Visit: Payer: Medicare Other | Admitting: Podiatry

## 2020-08-13 DIAGNOSIS — M1A0710 Idiopathic chronic gout, right ankle and foot, without tophus (tophi): Secondary | ICD-10-CM

## 2020-08-13 DIAGNOSIS — M1A9XX1 Chronic gout, unspecified, with tophus (tophi): Secondary | ICD-10-CM

## 2020-08-13 DIAGNOSIS — L97512 Non-pressure chronic ulcer of other part of right foot with fat layer exposed: Secondary | ICD-10-CM | POA: Diagnosis not present

## 2020-08-13 NOTE — Progress Notes (Signed)
  Subjective:  Patient ID: Robert Chapman, male    DOB: 04/08/1933,  MRN: [id number]  Chief Complaint  Patient presents with  . Foot Problem    The 5th toe on the right is better but just not getting there and I did have a slipper on and it did get rough again   87 y.o. male presents for wound care. Hx confirmed with patient. States the wound has not fully healed and continues to drain. Has pain when wearing normal shoes but the surgical shoe helps. Objective:  Physical Exam: Wound Location: right 5th toe Wound size: 0.3x0.4 Wound Base: granular Peri-wound: intact Exudate: Chalky   Nails thickened, dystrophic. Poorly palpable pedal pulses. Assessment:   1. Chronic gout of right foot, unspecified cause   2. Ulcer of toe, right, with fat layer exposed ([place])   3. Tophi    Plan:  Patient was evaluated and treated and all questions answered.  Ulcerated gout right 5th toe with abscess -Wound worsened and still with chalky exudate. -Wound debrided today and thoroughly cleansed -Continue abx ointment and band-aid -Will refer to Dr. Kathlene November of Rheumatology for evaluation of tophaceous gout. I am concerned his ulcer may not fully heal due to the local tophus that continues to slowly drain chalky exudate.  Procedure: Selective Debridement of Wound Rationale: Removal of devitalized tissue from the wound to promote healing.  Pre-Debridement Wound Measurements: 0.3 cm x 0.4 cm x 0.2 cm  Post-Debridement Wound Measurements: same as pre-debridement. Type of Debridement: sharp selective Instrumentation: dermal curette, tissue nipper Tissue Removed: Devitalized soft-tissue Dressing: Dry, sterile, compression dressing. Disposition: Patient tolerated procedure well.    Return in about 3 weeks (around 09/03/2020) for Ulcer f/u, Gout.

## 2020-08-14 ENCOUNTER — Ambulatory Visit: Payer: Medicare Other | Admitting: Cardiology

## 2020-08-14 ENCOUNTER — Encounter: Payer: Self-pay | Admitting: Cardiology

## 2020-08-14 VITALS — BP 136/80 | HR 96 | Ht 68.0 in | Wt 169.6 lb

## 2020-08-14 DIAGNOSIS — I480 Paroxysmal atrial fibrillation: Secondary | ICD-10-CM

## 2020-08-14 DIAGNOSIS — I10 Essential (primary) hypertension: Secondary | ICD-10-CM | POA: Diagnosis not present

## 2020-08-14 DIAGNOSIS — Z1329 Encounter for screening for other suspected endocrine disorder: Secondary | ICD-10-CM | POA: Diagnosis not present

## 2020-08-14 DIAGNOSIS — E782 Mixed hyperlipidemia: Secondary | ICD-10-CM

## 2020-08-14 DIAGNOSIS — N189 Chronic kidney disease, unspecified: Secondary | ICD-10-CM | POA: Diagnosis not present

## 2020-08-14 DIAGNOSIS — Z131 Encounter for screening for diabetes mellitus: Secondary | ICD-10-CM | POA: Diagnosis not present

## 2020-08-14 HISTORY — DX: Mixed hyperlipidemia: E78.2

## 2020-08-14 NOTE — Addendum Note (Signed)
Addended by: Truddie Hidden on: 08/14/2020 02:58 PM   Modules accepted: Orders

## 2020-08-14 NOTE — Patient Instructions (Signed)
Medication Instructions:  No medication changes. *If you need a refill on your cardiac medications before your next appointment, please call your pharmacy*   Lab Work: Your physician recommends that you have labs done in the office today. Your test included  basic metabolic panel, complete blood count, TSH, hgb A1C, vitamin D, liver function and lipids.  If you have labs (blood work) drawn today and your tests are completely normal, you will receive your results only by: [place] MyChart Message (if you have MyChart) OR . A paper copy in the mail If you have any lab test that is abnormal or we need to change your treatment, we will call you to review the results.   Testing/Procedures: None ordered   Follow-Up: At [facility], you and your health needs are our priority.  As part of our continuing mission to provide you with exceptional heart care, we have created designated Provider Care Teams.  These Care Teams include your primary Cardiologist (physician) and Advanced Practice Providers (APPs -  Physician Assistants and Nurse Practitioners) who all work together to provide you with the care you need, when you need it.  We recommend signing up for the patient portal called "MyChart".  Sign up information is provided on this After Visit Summary.  MyChart is used to connect with patients for Virtual Visits (Telemedicine).  Patients are able to view lab/test results, encounter notes, upcoming appointments, etc.  Non-urgent messages can be sent to your provider as well.   To learn more about what you can do with MyChart, go to [email or web address].    Your next appointment:   6 month(s)  The format for your next appointment:   In Person  Provider:   Jyl Heinz, MD   Other Instructions NA

## 2020-08-14 NOTE — Progress Notes (Signed)
Cardiology Office Note:    Date:  08/14/2020   ID:  Robert Chapman, DOB 1934-03-04, MRN [id number]  PCP:  Nicholos Johns, MD  Cardiologist:  Jenean Lindau, MD   Referring MD: Nicholos Johns, MD    ASSESSMENT:    1. PAF (paroxysmal atrial fibrillation) ([place])   2. Chronic renal impairment, unspecified CKD stage   3. Essential hypertension   4. Mixed dyslipidemia    PLAN:    In order of problems listed above:  1. Coronary artery disease: Secondary prevention stressed with the patient.  Importance of compliance with diet medication stressed any vocalized understanding.  He was told to ambulate to the best of his ability and be active. 2. Paroxysmal atrial fibrillation:I discussed with the patient atrial fibrillation, disease process. Management and therapy including rate and rhythm control, anticoagulation benefits and potential risks were discussed extensively with the patient. Patient had multiple questions which were answered to patient's satisfaction. 3. Mixed dyslipidemia: Lipids were noted.  He will have blood work today diet was emphasized. 4. Essential hypertension: Blood pressure stable and diet was emphasized.  Lifestyle modification urged 5. Renal insufficiency: Stable followed by primary care and we will check a Chem-7 today of course 6. Patient will be seen in follow-up appointment in 6 months or earlier if the patient has any concerns    Medication Adjustments/Labs and Tests Ordered: Current medicines are reviewed at length with the patient today.  Concerns regarding medicines are outlined above.  No orders of the defined types were placed in this encounter.  No orders of the defined types were placed in this encounter.    No chief complaint on file.    History of Present Illness:    Robert Chapman is a 87 y.o. male.  Patient has past medical history of coronary artery disease, essential hypertension, mixed dyslipidemia and paroxysmal atrial fibrillation.  He has  renal insufficiency.  He denies any problems at this time and takes care of activities of daily living.  No chest pain orthopnea or PND.  At the time of my evaluation, the patient is alert awake oriented and in no distress.  Past Medical History:  Diagnosis Date  . ACUTE KIDNEY FAILURE UNSPECIFIED 06/08/2010   Qualifier: Diagnosis of  By: Chase Caller MD, Murali    . ACUTE RESPIRATORY FAILURE 06/08/2010   Qualifier: Diagnosis of  By: Chase Caller MD, Murali    . ANEMIA IN CHRONIC KIDNEY DISEASE 06/08/2010   Qualifier: Diagnosis of  By: Chase Caller MD, Murali    . CRI (chronic renal insufficiency) 10/08/2014  . Essential hypertension 10/08/2014  . MYOCARDIAL INFARCTION 06/08/2010   Qualifier: History of  By: Charma Igo    . Old myocardial infarction 06/08/2010   Qualifier: History of  By: Charma Igo    . PAF (paroxysmal atrial fibrillation) ([place]) 10/08/2014  . Pure hypercholesterolemia 10/08/2014    Past Surgical History:  Procedure Laterality Date  . heart surgery      Current Medications: Current Meds  Medication Sig  . amLODipine (NORVASC) 10 MG tablet Take 10 mg by mouth daily.  Marland Kitchen atenolol (TENORMIN) 25 MG tablet Take 25 mg by mouth 2 (two) times daily.  . febuxostat (ULORIC) 40 MG tablet Take 40 mg by mouth daily.  . fenofibrate 160 MG tablet Take 160 mg by mouth daily.  . furosemide (LASIX) 20 MG tablet Take 10-20 mg by mouth as needed for fluid or edema.  . Niacinamide-Zn-Cu-Methfo-Se-Cr (NICOTINAMIDE PO) Take 500 mg by mouth every evening.  Marland Kitchen  potassium chloride (KLOR-CON) 20 MEQ packet Take 10 mEq by mouth as needed for fluid. Take in conjunction with Lasix  . pravastatin (PRAVACHOL) 40 MG tablet Take 1 tablet (40 mg total) by mouth daily.  . Rivaroxaban (XARELTO) 15 MG TABS tablet Take 15 mg by mouth daily.  . tamsulosin (FLOMAX) 0.4 MG CAPS capsule Take 0.4 mg by mouth daily.  . Vitamin D, Ergocalciferol, (DRISDOL) 1.25 MG (50000 UNIT) CAPS capsule Take 50,000 Units by mouth once a  week.     Allergies:   Patient has no known allergies.   Social History   Socioeconomic History  . Marital status: Married    Spouse name: Not on file  . Number of children: Not on file  . Years of education: Not on file  . Highest education level: Not on file  Occupational History  . Not on file  Tobacco Use  . Smoking status: Never Smoker  . Smokeless tobacco: Never Used  Substance and Sexual Activity  . Alcohol use: Not on file  . Drug use: Not on file  . Sexual activity: Not on file  Other Topics Concern  . Not on file  Social History Narrative  . Not on file   Social Determinants of Health   Financial Resource Strain: Not on file  Food Insecurity: Not on file  Transportation Needs: Not on file  Physical Activity: Not on file  Stress: Not on file  Social Connections: Not on file     Family History: The patient's family history includes Cancer in his father; Hypertension in his mother.  ROS:   Please see the history of present illness.    All other systems reviewed and are negative.  EKGs/Labs/Other Studies Reviewed:    The following studies were reviewed today: I discussed my findings with the patient at length.   Recent Labs: 02/05/2020: ALT 11; BUN 35; Creatinine, Ser 1.93; Hemoglobin 13.2; Platelets 237; Potassium 4.1; Sodium 137; TSH 0.584  Recent Lipid Panel    Component Value Date/Time   CHOL 162 02/05/2020 0838   TRIG 115 02/05/2020 0838   HDL 37 (L) 02/05/2020 0838   CHOLHDL 4.4 02/05/2020 0838   LDLCALC 104 (H) 02/05/2020 0838    Physical Exam:    VS:  BP 136/80   Pulse 96   Ht 5\' 8"  (1.727 m)   Wt 169 lb 9.6 oz (76.9 kg)   SpO2 96%   BMI 25.79 kg/m     Wt Readings from Last 3 Encounters:  08/14/20 169 lb 9.6 oz (76.9 kg)  02/05/20 178 lb 3.2 oz (80.8 kg)  08/27/18 187 lb (84.8 kg)     GEN: Patient is in no acute distress HEENT: Normal NECK: No JVD; No carotid bruits LYMPHATICS: No lymphadenopathy CARDIAC: Hear sounds  regular, 2/6 systolic murmur at the apex. RESPIRATORY:  Clear to auscultation without rales, wheezing or rhonchi  ABDOMEN: Soft, non-tender, non-distended MUSCULOSKELETAL:  No edema; No deformity  SKIN: Warm and dry NEUROLOGIC:  Alert and oriented x 3 PSYCHIATRIC:  Normal affect   Signed, Jenean Lindau, MD  08/14/2020 2:44 PM    [facility]

## 2020-08-17 LAB — HEPATIC FUNCTION PANEL
ALT: 10 IU/L (ref 0–44)
AST: 27 IU/L (ref 0–40)
Albumin: 4.4 g/dL (ref 3.6–4.6)
Alkaline Phosphatase: 37 IU/L — ABNORMAL LOW (ref 44–121)
Bilirubin Total: 0.6 mg/dL (ref 0.0–1.2)
Bilirubin, Direct: 0.22 mg/dL (ref 0.00–0.40)
Total Protein: 7.6 g/dL (ref 6.0–8.5)

## 2020-08-17 LAB — CBC WITH DIFFERENTIAL/PLATELET
Basophils Absolute: 0 10*3/uL (ref 0.0–0.2)
Basos: 0 %
EOS (ABSOLUTE): 0.4 10*3/uL (ref 0.0–0.4)
Eos: 6 %
Hematocrit: 39.1 % (ref 37.5–51.0)
Hemoglobin: 12.8 g/dL — ABNORMAL LOW (ref 13.0–17.7)
Immature Grans (Abs): 0 10*3/uL (ref 0.0–0.1)
Immature Granulocytes: 0 %
Lymphocytes Absolute: 1.6 10*3/uL (ref 0.7–3.1)
Lymphs: 23 %
MCH: 31.1 pg (ref 26.6–33.0)
MCHC: 32.7 g/dL (ref 31.5–35.7)
MCV: 95 fL (ref 79–97)
Monocytes Absolute: 0.5 10*3/uL (ref 0.1–0.9)
Monocytes: 7 %
Neutrophils Absolute: 4.6 10*3/uL (ref 1.4–7.0)
Neutrophils: 64 %
Platelets: 195 10*3/uL (ref 150–450)
RBC: 4.11 x10E6/uL — ABNORMAL LOW (ref 4.14–5.80)
RDW: 13.4 % (ref 11.6–15.4)
WBC: 7.2 10*3/uL (ref 3.4–10.8)

## 2020-08-17 LAB — BASIC METABOLIC PANEL
BUN/Creatinine Ratio: 18 (ref 10–24)
BUN: 48 mg/dL — ABNORMAL HIGH (ref 8–27)
CO2: 22 mmol/L (ref 20–29)
Calcium: 10.2 mg/dL (ref 8.6–10.2)
Chloride: 96 mmol/L (ref 96–106)
Creatinine, Ser: 2.68 mg/dL — ABNORMAL HIGH (ref 0.76–1.27)
Glucose: 160 mg/dL — ABNORMAL HIGH (ref 65–99)
Potassium: 3.9 mmol/L (ref 3.5–5.2)
Sodium: 140 mmol/L (ref 134–144)
eGFR: 23 mL/min/{1.73_m2} — ABNORMAL LOW (ref >59)

## 2020-08-17 LAB — LIPID PANEL
Chol/HDL Ratio: 5.3 ratio — ABNORMAL HIGH (ref 0.0–5.0)
Cholesterol, Total: 138 mg/dL (ref 100–199)
HDL: 26 mg/dL — ABNORMAL LOW (ref >39)
LDL Chol Calc (NIH): 85 mg/dL (ref 0–99)
Triglycerides: 154 mg/dL — ABNORMAL HIGH (ref 0–149)
VLDL Cholesterol Cal: 27 mg/dL (ref 5–40)

## 2020-08-17 LAB — HEMOGLOBIN A1C
Est. average glucose Bld gHb Est-mCnc: 114 mg/dL
Hgb A1c MFr Bld: 5.6 % (ref 4.8–5.6)

## 2020-08-17 LAB — VITAMIN D 25 HYDROXY (VIT D DEFICIENCY, FRACTURES): Vit D, 25-Hydroxy: 70.9 ng/mL (ref 30.0–100.0)

## 2020-08-17 LAB — TSH: TSH: 0.915 u[IU]/mL (ref 0.450–4.500)

## 2020-08-19 ENCOUNTER — Telehealth: Payer: Self-pay | Admitting: Cardiology

## 2020-08-19 DIAGNOSIS — M79672 Pain in left foot: Secondary | ICD-10-CM | POA: Diagnosis not present

## 2020-08-19 DIAGNOSIS — M79641 Pain in right hand: Secondary | ICD-10-CM | POA: Diagnosis not present

## 2020-08-19 DIAGNOSIS — M25521 Pain in right elbow: Secondary | ICD-10-CM | POA: Diagnosis not present

## 2020-08-19 DIAGNOSIS — M79642 Pain in left hand: Secondary | ICD-10-CM | POA: Diagnosis not present

## 2020-08-19 DIAGNOSIS — M109 Gout, unspecified: Secondary | ICD-10-CM | POA: Diagnosis not present

## 2020-08-19 DIAGNOSIS — M2012 Hallux valgus (acquired), left foot: Secondary | ICD-10-CM | POA: Diagnosis not present

## 2020-08-19 DIAGNOSIS — M1A9XX1 Chronic gout, unspecified, with tophus (tophi): Secondary | ICD-10-CM | POA: Diagnosis not present

## 2020-08-19 DIAGNOSIS — M810 Age-related osteoporosis without current pathological fracture: Secondary | ICD-10-CM | POA: Diagnosis not present

## 2020-08-19 DIAGNOSIS — M25522 Pain in left elbow: Secondary | ICD-10-CM | POA: Diagnosis not present

## 2020-08-19 DIAGNOSIS — M19042 Primary osteoarthritis, left hand: Secondary | ICD-10-CM | POA: Diagnosis not present

## 2020-08-19 DIAGNOSIS — M79676 Pain in unspecified toe(s): Secondary | ICD-10-CM | POA: Diagnosis not present

## 2020-08-19 DIAGNOSIS — M10071 Idiopathic gout, right ankle and foot: Secondary | ICD-10-CM | POA: Diagnosis not present

## 2020-08-19 DIAGNOSIS — N184 Chronic kidney disease, stage 4 (severe): Secondary | ICD-10-CM | POA: Diagnosis not present

## 2020-08-19 DIAGNOSIS — M7989 Other specified soft tissue disorders: Secondary | ICD-10-CM | POA: Diagnosis not present

## 2020-08-19 DIAGNOSIS — M79671 Pain in right foot: Secondary | ICD-10-CM | POA: Diagnosis not present

## 2020-08-19 DIAGNOSIS — M19041 Primary osteoarthritis, right hand: Secondary | ICD-10-CM | POA: Diagnosis not present

## 2020-08-19 NOTE — Telephone Encounter (Signed)
Pt's daughter Rise Paganini is returning call from earlier this morning concerning Mr. Babler. Please advise

## 2020-08-19 NOTE — Telephone Encounter (Signed)
Results reviewed with pt as per Dr. Revankar's note.  Pt verbalized understanding and had no additional questions. Routed to PCP.  

## 2020-08-28 ENCOUNTER — Other Ambulatory Visit: Payer: Self-pay | Admitting: Cardiology

## 2020-09-03 ENCOUNTER — Encounter: Payer: Self-pay | Admitting: Podiatry

## 2020-09-03 ENCOUNTER — Ambulatory Visit: Payer: Medicare Other | Admitting: Podiatry

## 2020-09-03 ENCOUNTER — Other Ambulatory Visit: Payer: Self-pay

## 2020-09-03 DIAGNOSIS — L97512 Non-pressure chronic ulcer of other part of right foot with fat layer exposed: Secondary | ICD-10-CM | POA: Diagnosis not present

## 2020-09-03 DIAGNOSIS — M1A0710 Idiopathic chronic gout, right ankle and foot, without tophus (tophi): Secondary | ICD-10-CM | POA: Diagnosis not present

## 2020-09-03 DIAGNOSIS — M1A9XX1 Chronic gout, unspecified, with tophus (tophi): Secondary | ICD-10-CM | POA: Diagnosis not present

## 2020-09-14 DIAGNOSIS — I10 Essential (primary) hypertension: Secondary | ICD-10-CM | POA: Diagnosis not present

## 2020-09-14 DIAGNOSIS — D649 Anemia, unspecified: Secondary | ICD-10-CM | POA: Diagnosis not present

## 2020-09-14 DIAGNOSIS — I4891 Unspecified atrial fibrillation: Secondary | ICD-10-CM | POA: Diagnosis not present

## 2020-09-14 DIAGNOSIS — J309 Allergic rhinitis, unspecified: Secondary | ICD-10-CM | POA: Diagnosis not present

## 2020-09-14 DIAGNOSIS — R600 Localized edema: Secondary | ICD-10-CM | POA: Diagnosis not present

## 2020-09-14 DIAGNOSIS — E039 Hypothyroidism, unspecified: Secondary | ICD-10-CM | POA: Diagnosis not present

## 2020-09-14 DIAGNOSIS — E559 Vitamin D deficiency, unspecified: Secondary | ICD-10-CM | POA: Diagnosis not present

## 2020-09-14 DIAGNOSIS — E782 Mixed hyperlipidemia: Secondary | ICD-10-CM | POA: Diagnosis not present

## 2020-09-14 DIAGNOSIS — R739 Hyperglycemia, unspecified: Secondary | ICD-10-CM | POA: Diagnosis not present

## 2020-09-14 DIAGNOSIS — N289 Disorder of kidney and ureter, unspecified: Secondary | ICD-10-CM | POA: Diagnosis not present

## 2020-09-25 NOTE — Progress Notes (Signed)
  Subjective:  Patient ID: Robert Chapman, male    DOB: 1933/11/17,  MRN: [id number]  Chief Complaint  Patient presents with   Foot Problem    The 5th toe is doing ok on the right and I have not tried it with the regular shoes   87 y.o. male presents for wound care. Hx confirmed with patient.   Objective:  Physical Exam: Wound Location: right 5th toe Wound size: pinpoint Wound Base: granular Peri-wound: intact Exudate: Chalky   Nails thickened, dystrophic. Poorly palpable pedal pulses. Assessment:   1. Chronic gout of right foot, unspecified cause   2. Ulcer of toe, right, with fat layer exposed ([place])   3. Tophi     Plan:  Patient was evaluated and treated and all questions answered.  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  He has been seen by rheumatology and Krystexxa therapy has been recommended.  I think this will help to prevent recurrence of the ulceration.  The ulcer was minimally debrided today and dressed with Medihoney and Band-Aid.  We will monitor while he is on Krystexxa therapy for hopeful resolution of the ulceration  Return in about 1 week (around 09/10/2020) for Wound Care.

## 2020-09-30 DIAGNOSIS — M1A9XX1 Chronic gout, unspecified, with tophus (tophi): Secondary | ICD-10-CM | POA: Diagnosis not present

## 2020-10-05 ENCOUNTER — Other Ambulatory Visit: Payer: Self-pay

## 2020-10-05 ENCOUNTER — Ambulatory Visit: Payer: Medicare Other | Admitting: Podiatry

## 2020-10-05 DIAGNOSIS — M1A9XX1 Chronic gout, unspecified, with tophus (tophi): Secondary | ICD-10-CM | POA: Diagnosis not present

## 2020-10-05 DIAGNOSIS — L97512 Non-pressure chronic ulcer of other part of right foot with fat layer exposed: Secondary | ICD-10-CM | POA: Diagnosis not present

## 2020-10-05 DIAGNOSIS — M1A0710 Idiopathic chronic gout, right ankle and foot, without tophus (tophi): Secondary | ICD-10-CM

## 2020-10-05 NOTE — Progress Notes (Signed)
  Subjective:  Patient ID: Robert Chapman, male    DOB: [birth year],  MRN: [id number]  Chief Complaint  Patient presents with   Gout    F/U Lt 5th toe -pt states," doing better." 1/10 soreness-less redness/swelling -no open -w/ scabbed tx: therapy and sx shoe   87 y.o. male presents for wound care. Hx confirmed with patient.  Has had one infusion so far of Krystexxa Objective:  Physical Exam: Wound Location: right 5th toe Wound size: 0.3x0.1 Wound Base: granular Peri-wound: intact Exudate: Chalky   Nails thickened, dystrophic. Poorly palpable pedal pulses. Assessment:   1. Chronic gout of right foot, unspecified cause   2. Ulcer of toe, right, with fat layer exposed ([place])   3. Tophi      Plan:  Patient was evaluated and treated and all questions answered.  Ulcerated gout right 5th toe with abscess -He has had one infusion, hopefully with continued infusions his tophi should reduce. This should allow the ulcer to heal. This was minimally debrided today. Dressed with iodosorb and band-aid. F/u in 1 month for recheck.  Return in about 4 weeks (around 11/02/2020) for Wound Care.

## 2020-10-20 DIAGNOSIS — M1A9XX1 Chronic gout, unspecified, with tophus (tophi): Secondary | ICD-10-CM | POA: Diagnosis not present

## 2020-10-23 DIAGNOSIS — M79676 Pain in unspecified toe(s): Secondary | ICD-10-CM | POA: Diagnosis not present

## 2020-10-23 DIAGNOSIS — N184 Chronic kidney disease, stage 4 (severe): Secondary | ICD-10-CM | POA: Diagnosis not present

## 2020-10-23 DIAGNOSIS — M1A9XX1 Chronic gout, unspecified, with tophus (tophi): Secondary | ICD-10-CM | POA: Diagnosis not present

## 2020-10-23 DIAGNOSIS — M7989 Other specified soft tissue disorders: Secondary | ICD-10-CM | POA: Diagnosis not present

## 2020-10-23 DIAGNOSIS — M109 Gout, unspecified: Secondary | ICD-10-CM | POA: Diagnosis not present

## 2020-11-04 DIAGNOSIS — M1A9XX1 Chronic gout, unspecified, with tophus (tophi): Secondary | ICD-10-CM | POA: Diagnosis not present

## 2020-11-05 ENCOUNTER — Ambulatory Visit: Payer: Medicare Other | Admitting: Podiatry

## 2020-11-05 ENCOUNTER — Other Ambulatory Visit: Payer: Self-pay

## 2020-11-05 ENCOUNTER — Encounter: Payer: Self-pay | Admitting: Podiatry

## 2020-11-05 DIAGNOSIS — L97512 Non-pressure chronic ulcer of other part of right foot with fat layer exposed: Secondary | ICD-10-CM | POA: Diagnosis not present

## 2020-11-05 DIAGNOSIS — M1A0710 Idiopathic chronic gout, right ankle and foot, without tophus (tophi): Secondary | ICD-10-CM | POA: Diagnosis not present

## 2020-11-05 DIAGNOSIS — M1A9XX1 Chronic gout, unspecified, with tophus (tophi): Secondary | ICD-10-CM

## 2020-11-05 DIAGNOSIS — M109 Gout, unspecified: Secondary | ICD-10-CM | POA: Insufficient documentation

## 2020-11-05 HISTORY — DX: Gout, unspecified: M10.9

## 2020-11-05 NOTE — Progress Notes (Signed)
  Subjective:  Patient ID: Robert Chapman, male    DOB: 12-23-1933,  MRN: [id number]  Chief Complaint  Patient presents with   Foot Ulcer    I am doing a lot better on the 5th toe right    87 y.o. male presents for wound care. Hx confirmed with patient.  Continues to do well with the infusions, he has no pain or discomfort. Objective:  Physical Exam: Wound Location: right 5th toe Wound size: n/a Wound Base: healed Peri-wound: intact Exudate: none   Nails thickened, dystrophic. Poorly palpable pedal pulses. Assessment:   1. Chronic gout of right foot, unspecified cause   2. Ulcer of toe, right, with fat layer exposed ([place])   3. Tophi    Plan:  Patient was evaluated and treated and all questions answered.  Ulcerated gout right 5th toe with abscess -The ulcer now appears healed. He does not have any tophaceous discharge and the tophus at the 5th toe appears resolved. Continue infusions under direction of Dr. Kathlene November until other goals of therapy met. Patient does feel like his joints are doing better, but he does still have a large tophus at his elbow. -F/u in 6 weeks for recheck, then can likely d/c if ulcer has not recurred.  Return in about 6 weeks (around 12/17/2020) for Ulcer check.

## 2020-11-06 DIAGNOSIS — M1A9XX1 Chronic gout, unspecified, with tophus (tophi): Secondary | ICD-10-CM | POA: Diagnosis not present

## 2020-11-23 DIAGNOSIS — M1A9XX1 Chronic gout, unspecified, with tophus (tophi): Secondary | ICD-10-CM | POA: Diagnosis not present

## 2020-11-25 DIAGNOSIS — M1A9XX1 Chronic gout, unspecified, with tophus (tophi): Secondary | ICD-10-CM | POA: Diagnosis not present

## 2020-12-09 DIAGNOSIS — M1A9XX1 Chronic gout, unspecified, with tophus (tophi): Secondary | ICD-10-CM | POA: Diagnosis not present

## 2020-12-11 DIAGNOSIS — M1A9XX1 Chronic gout, unspecified, with tophus (tophi): Secondary | ICD-10-CM | POA: Diagnosis not present

## 2020-12-16 DIAGNOSIS — I10 Essential (primary) hypertension: Secondary | ICD-10-CM | POA: Diagnosis not present

## 2020-12-16 DIAGNOSIS — N289 Disorder of kidney and ureter, unspecified: Secondary | ICD-10-CM | POA: Diagnosis not present

## 2020-12-16 DIAGNOSIS — D649 Anemia, unspecified: Secondary | ICD-10-CM | POA: Diagnosis not present

## 2020-12-16 DIAGNOSIS — J309 Allergic rhinitis, unspecified: Secondary | ICD-10-CM | POA: Diagnosis not present

## 2020-12-16 DIAGNOSIS — I4891 Unspecified atrial fibrillation: Secondary | ICD-10-CM | POA: Diagnosis not present

## 2020-12-16 DIAGNOSIS — E782 Mixed hyperlipidemia: Secondary | ICD-10-CM | POA: Diagnosis not present

## 2020-12-16 DIAGNOSIS — R600 Localized edema: Secondary | ICD-10-CM | POA: Diagnosis not present

## 2020-12-16 DIAGNOSIS — E039 Hypothyroidism, unspecified: Secondary | ICD-10-CM | POA: Diagnosis not present

## 2020-12-16 DIAGNOSIS — E559 Vitamin D deficiency, unspecified: Secondary | ICD-10-CM | POA: Diagnosis not present

## 2020-12-16 DIAGNOSIS — R739 Hyperglycemia, unspecified: Secondary | ICD-10-CM | POA: Diagnosis not present

## 2020-12-23 DIAGNOSIS — M1A9XX1 Chronic gout, unspecified, with tophus (tophi): Secondary | ICD-10-CM | POA: Diagnosis not present

## 2020-12-24 ENCOUNTER — Ambulatory Visit: Payer: Medicare Other | Admitting: Podiatry

## 2020-12-25 DIAGNOSIS — M1A9XX1 Chronic gout, unspecified, with tophus (tophi): Secondary | ICD-10-CM | POA: Diagnosis not present

## 2021-01-06 DIAGNOSIS — M1A9XX1 Chronic gout, unspecified, with tophus (tophi): Secondary | ICD-10-CM | POA: Diagnosis not present

## 2021-01-07 ENCOUNTER — Encounter: Payer: Self-pay | Admitting: Podiatry

## 2021-01-07 ENCOUNTER — Other Ambulatory Visit: Payer: Self-pay

## 2021-01-07 ENCOUNTER — Ambulatory Visit: Payer: Medicare Other | Admitting: Podiatry

## 2021-01-07 DIAGNOSIS — M1A9XX1 Chronic gout, unspecified, with tophus (tophi): Secondary | ICD-10-CM

## 2021-01-07 DIAGNOSIS — M1A0710 Idiopathic chronic gout, right ankle and foot, without tophus (tophi): Secondary | ICD-10-CM

## 2021-01-07 DIAGNOSIS — L97512 Non-pressure chronic ulcer of other part of right foot with fat layer exposed: Secondary | ICD-10-CM

## 2021-01-07 NOTE — Progress Notes (Signed)
  Subjective:  Patient ID: Robert Chapman, male    DOB: 07-Jan-1934,  MRN: [id number]  Chief Complaint  Patient presents with   Foot Pain    The right foot is not hurting any more and it has gotten better   87 y.o. male presents for wound care. Hx confirmed with patient.  States he has 6 infusions left the toe is doing well he does not have any pain or discomfort Objective:  Physical Exam: Wound Location: right 5th toe Wound size: n/a Wound Base: healed Peri-wound: intact Exudate: none   Nails thickened, dystrophic. Poorly palpable pedal pulses. Assessment:   1. Chronic gout of right foot, unspecified cause   2. Ulcer of toe, right, with fat layer exposed ([place])   3. Tophi     Plan:  Patient was evaluated and treated and all questions answered.  Ulcerated gout right 5th toe with abscess -The ulcer remains healed.  He does not have any evident tophi around the lesion with the fifth toe.  I doubt the ulceration will recur.  Recommend he complete the prescribed course of Krystexxa and follow-up should any issues occur  No follow-ups on file.

## 2021-01-08 DIAGNOSIS — M1A9XX1 Chronic gout, unspecified, with tophus (tophi): Secondary | ICD-10-CM | POA: Diagnosis not present

## 2021-01-16 ENCOUNTER — Other Ambulatory Visit: Payer: Self-pay | Admitting: Cardiology

## 2021-01-18 NOTE — Telephone Encounter (Signed)
Prescription refill request for Xarelto received.  Indication: afib  Last office visit: Revankar, 08/14/2020 Weight: 76.9 kg  Age: 87 yo  Scr: 2.68, 08/14/2020 CrCl: 38ml/min   Refill sent.

## 2021-01-20 DIAGNOSIS — Z79899 Other long term (current) drug therapy: Secondary | ICD-10-CM | POA: Diagnosis not present

## 2021-01-20 DIAGNOSIS — M79676 Pain in unspecified toe(s): Secondary | ICD-10-CM | POA: Diagnosis not present

## 2021-01-20 DIAGNOSIS — M7989 Other specified soft tissue disorders: Secondary | ICD-10-CM | POA: Diagnosis not present

## 2021-01-20 DIAGNOSIS — Z23 Encounter for immunization: Secondary | ICD-10-CM | POA: Diagnosis not present

## 2021-01-20 DIAGNOSIS — M1A9XX1 Chronic gout, unspecified, with tophus (tophi): Secondary | ICD-10-CM | POA: Diagnosis not present

## 2021-01-20 DIAGNOSIS — N184 Chronic kidney disease, stage 4 (severe): Secondary | ICD-10-CM | POA: Diagnosis not present

## 2021-01-20 DIAGNOSIS — M109 Gout, unspecified: Secondary | ICD-10-CM | POA: Diagnosis not present

## 2021-01-22 DIAGNOSIS — M1A9XX1 Chronic gout, unspecified, with tophus (tophi): Secondary | ICD-10-CM | POA: Diagnosis not present

## 2021-02-03 DIAGNOSIS — M1A9XX1 Chronic gout, unspecified, with tophus (tophi): Secondary | ICD-10-CM | POA: Diagnosis not present

## 2021-02-05 DIAGNOSIS — M1A9XX1 Chronic gout, unspecified, with tophus (tophi): Secondary | ICD-10-CM | POA: Diagnosis not present

## 2021-02-16 ENCOUNTER — Other Ambulatory Visit: Payer: Self-pay | Admitting: Cardiology

## 2021-02-19 ENCOUNTER — Other Ambulatory Visit: Payer: Self-pay | Admitting: Cardiology

## 2021-02-24 DIAGNOSIS — M1A9XX1 Chronic gout, unspecified, with tophus (tophi): Secondary | ICD-10-CM | POA: Diagnosis not present

## 2021-02-26 DIAGNOSIS — M1A9XX1 Chronic gout, unspecified, with tophus (tophi): Secondary | ICD-10-CM | POA: Diagnosis not present

## 2021-03-07 ENCOUNTER — Other Ambulatory Visit: Payer: Self-pay | Admitting: Cardiology

## 2021-03-10 DIAGNOSIS — M1A9XX1 Chronic gout, unspecified, with tophus (tophi): Secondary | ICD-10-CM | POA: Diagnosis not present

## 2021-03-12 DIAGNOSIS — M1A9XX1 Chronic gout, unspecified, with tophus (tophi): Secondary | ICD-10-CM | POA: Diagnosis not present

## 2021-03-24 DIAGNOSIS — M1A9XX1 Chronic gout, unspecified, with tophus (tophi): Secondary | ICD-10-CM | POA: Diagnosis not present

## 2021-03-26 DIAGNOSIS — M1A9XX1 Chronic gout, unspecified, with tophus (tophi): Secondary | ICD-10-CM | POA: Diagnosis not present

## 2021-04-07 ENCOUNTER — Other Ambulatory Visit: Payer: Self-pay

## 2021-04-07 DIAGNOSIS — I10 Essential (primary) hypertension: Secondary | ICD-10-CM | POA: Diagnosis not present

## 2021-04-07 DIAGNOSIS — I4891 Unspecified atrial fibrillation: Secondary | ICD-10-CM | POA: Diagnosis not present

## 2021-04-07 DIAGNOSIS — M1A9XX1 Chronic gout, unspecified, with tophus (tophi): Secondary | ICD-10-CM | POA: Diagnosis not present

## 2021-04-07 DIAGNOSIS — D649 Anemia, unspecified: Secondary | ICD-10-CM | POA: Diagnosis not present

## 2021-04-07 DIAGNOSIS — E559 Vitamin D deficiency, unspecified: Secondary | ICD-10-CM | POA: Diagnosis not present

## 2021-04-07 DIAGNOSIS — J309 Allergic rhinitis, unspecified: Secondary | ICD-10-CM | POA: Diagnosis not present

## 2021-04-07 DIAGNOSIS — N289 Disorder of kidney and ureter, unspecified: Secondary | ICD-10-CM | POA: Diagnosis not present

## 2021-04-07 DIAGNOSIS — E039 Hypothyroidism, unspecified: Secondary | ICD-10-CM | POA: Diagnosis not present

## 2021-04-07 DIAGNOSIS — R739 Hyperglycemia, unspecified: Secondary | ICD-10-CM | POA: Diagnosis not present

## 2021-04-07 DIAGNOSIS — E782 Mixed hyperlipidemia: Secondary | ICD-10-CM | POA: Diagnosis not present

## 2021-04-07 DIAGNOSIS — R600 Localized edema: Secondary | ICD-10-CM | POA: Diagnosis not present

## 2021-04-09 DIAGNOSIS — M1A9XX1 Chronic gout, unspecified, with tophus (tophi): Secondary | ICD-10-CM | POA: Diagnosis not present

## 2021-04-12 ENCOUNTER — Encounter: Payer: Self-pay | Admitting: Cardiology

## 2021-04-12 ENCOUNTER — Other Ambulatory Visit: Payer: Self-pay

## 2021-04-12 ENCOUNTER — Ambulatory Visit: Payer: Medicare Other | Admitting: Cardiology

## 2021-04-12 VITALS — BP 132/80 | HR 76 | Ht 68.0 in | Wt 163.8 lb

## 2021-04-12 DIAGNOSIS — Z1321 Encounter for screening for nutritional disorder: Secondary | ICD-10-CM | POA: Diagnosis not present

## 2021-04-12 DIAGNOSIS — N189 Chronic kidney disease, unspecified: Secondary | ICD-10-CM

## 2021-04-12 DIAGNOSIS — E782 Mixed hyperlipidemia: Secondary | ICD-10-CM | POA: Diagnosis not present

## 2021-04-12 DIAGNOSIS — I480 Paroxysmal atrial fibrillation: Secondary | ICD-10-CM | POA: Diagnosis not present

## 2021-04-12 DIAGNOSIS — I10 Essential (primary) hypertension: Secondary | ICD-10-CM

## 2021-04-12 DIAGNOSIS — E559 Vitamin D deficiency, unspecified: Secondary | ICD-10-CM | POA: Diagnosis not present

## 2021-04-12 DIAGNOSIS — E7800 Pure hypercholesterolemia, unspecified: Secondary | ICD-10-CM

## 2021-04-12 NOTE — Addendum Note (Signed)
Addended by: Truddie Hidden on: 04/12/2021 11:55 AM   Modules accepted: Orders

## 2021-04-12 NOTE — Patient Instructions (Signed)
Medication Instructions:  Your physician recommends that you continue on your current medications as directed. Please refer to the Current Medication list given to you today.  *If you need a refill on your cardiac medications before your next appointment, please call your pharmacy*   Lab Work: Your physician recommends that you have labs done in the office today. Your test included  basic metabolic panel, complete blood count, TSH, vitamin D, liver function and lipids.  If you have labs (blood work) drawn today and your tests are completely normal, you will receive your results only by: [place] (if you have MyChart) OR A paper copy in the mail If you have any lab test that is abnormal or we need to change your treatment, we will call you to review the results.   Testing/Procedures: None ordered   Follow-Up: At [facility], you and your health needs are our priority.  As part of our continuing mission to provide you with exceptional heart care, we have created designated Provider Care Teams.  These Care Teams include your primary Cardiologist (physician) and Advanced Practice Providers (APPs -  Physician Assistants and Nurse Practitioners) who all work together to provide you with the care you need, when you need it.  We recommend signing up for the patient portal called "MyChart".  Sign up information is provided on this After Visit Summary.  MyChart is used to connect with patients for Virtual Visits (Telemedicine).  Patients are able to view lab/test results, encounter notes, upcoming appointments, etc.  Non-urgent messages can be sent to your provider as well.   To learn more about what you can do with MyChart, go to [email or web address].    Your next appointment:   9 month(s)  The format for your next appointment:   In Person  Provider:   Jyl Heinz, MD   Other Instructions NA

## 2021-04-12 NOTE — Progress Notes (Signed)
Cardiology Office Note:    Date:  04/12/2021   ID:  Robert Chapman, DOB Nov 15, 1933, MRN [id number]  PCP:  Nicholos Johns, MD  Cardiologist:  Jenean Lindau, MD   Referring MD: Nicholos Johns, MD    ASSESSMENT:    1. PAF (paroxysmal atrial fibrillation) ([place])   2. Mixed dyslipidemia   3. Pure hypercholesterolemia   4. Chronic renal impairment, unspecified CKD stage    PLAN:    In order of problems listed above:  Primary prevention stressed with the patient.  Importance of compliance with diet medication stressed any vocalized understanding.  He was advised to stay active and ambulate to the best of his ability. Essential hypertension: Blood pressure stable and diet was emphasized.  Lifestyle modification was urged. Mixed dyslipidemia: On statin therapy and we will check lipids today. Permanent atrial fibrillation:I discussed with the patient atrial fibrillation, disease process. Management and therapy including rate and rhythm control, anticoagulation benefits and potential risks were discussed extensively with the patient. Patient had multiple questions which were answered to patient's satisfaction. Hypothyroidism: On replacement and TSH today he also gives history of vitamin D deficiency and wants to check and will do so. Patient will be seen in follow-up appointment in 6 months or earlier if the patient has any concerns    Medication Adjustments/Labs and Tests Ordered: Current medicines are reviewed at length with the patient today.  Concerns regarding medicines are outlined above.  No orders of the defined types were placed in this encounter.  No orders of the defined types were placed in this encounter.    No chief complaint on file.    History of Present Illness:    Robert Chapman is a 88 y.o. male.  Patient has past medical history of essential hypertension, mixed dyslipidemia, paroxysmal atrial fibrillation and renal insufficiency.  He denies any problems at this time  and takes care of activities of daily living.  No chest pain orthopnea or PND.  He ambulates age appropriately.  Past Medical History:  Diagnosis Date   ACUTE KIDNEY FAILURE UNSPECIFIED 06/08/2010   Qualifier: Diagnosis of  By: Chase Caller MD, Murali     ACUTE RESPIRATORY FAILURE 06/08/2010   Qualifier: Diagnosis of  By: Chase Caller MD, [place] IN CHRONIC KIDNEY DISEASE 06/08/2010   Qualifier: Diagnosis of  By: Chase Caller MD, Murali     CRI (chronic renal insufficiency) 10/08/2014   Essential hypertension 10/08/2014   Gout 11/05/2020   Mixed dyslipidemia 08/14/2020   MYOCARDIAL INFARCTION 06/08/2010   Qualifier: History of  By: Charma Igo     MYOCARDIAL INFARCTION 06/08/2010   Qualifier: History of  By: Charma Igo     Old myocardial infarction 06/08/2010   Qualifier: History of  By: Charma Igo     PAF (paroxysmal atrial fibrillation) ([place]) 10/08/2014   Pure hypercholesterolemia 10/08/2014    Past Surgical History:  Procedure Laterality Date   heart surgery      Current Medications: Current Meds  Medication Sig   amLODipine (NORVASC) 10 MG tablet Take 10 mg by mouth daily.   atenolol (TENORMIN) 25 MG tablet Take 1 tablet (25 mg total) by mouth 2 (two) times daily.   fenofibrate 160 MG tablet TAKE 1 TABLET BY MOUTH  DAILY   furosemide (LASIX) 40 MG tablet Take 40 mg by mouth daily.   levothyroxine (SYNTHROID) 112 MCG tablet Take 112 mcg by mouth daily before breakfast.   Multiple Vitamin (MULTIVITAMIN PO) Take 1 tablet by  mouth daily.   mycophenolate (CELLCEPT) 500 MG tablet Take 500 mg by mouth 2 (two) times daily.   Niacinamide-Zn-Cu-Methfo-Se-Cr (NICOTINAMIDE PO) Take 500 mg by mouth every evening.   potassium chloride (KLOR-CON) 20 MEQ packet Take 10 mEq by mouth every other day. Take in conjunction with Lasix   pravastatin (PRAVACHOL) 20 MG tablet Take 20 mg by mouth daily.   Rivaroxaban (XARELTO) 15 MG TABS tablet Take 1 tablet (15 mg total) by mouth daily with  supper.   tamsulosin (FLOMAX) 0.4 MG CAPS capsule Take 0.4 mg by mouth daily.   Vitamin D, Ergocalciferol, (DRISDOL) 1.25 MG (50000 UNIT) CAPS capsule Take 50,000 Units by mouth once a week.     Allergies:   Patient has no known allergies.   Social History   Socioeconomic History   Marital status: Married    Spouse name: Not on file   Number of children: Not on file   Years of education: Not on file   Highest education level: Not on file  Occupational History   Not on file  Tobacco Use   Smoking status: Never   Smokeless tobacco: Never  Substance and Sexual Activity   Alcohol use: Not on file   Drug use: Not on file   Sexual activity: Not on file  Other Topics Concern   Not on file  Social History Narrative   Not on file   Social Determinants of Health   Financial Resource Strain: Not on file  Food Insecurity: Not on file  Transportation Needs: Not on file  Physical Activity: Not on file  Stress: Not on file  Social Connections: Not on file     Family History: The patient's family history includes Cancer in his father; Hypertension in his mother.  ROS:   Please see the history of present illness.    All other systems reviewed and are negative.  EKGs/Labs/Other Studies Reviewed:    The following studies were reviewed today: Reveals atrial fibrillation with well-controlled ventricular rate.   Recent Labs: 08/14/2020: ALT 10; BUN 48; Creatinine, Ser 2.68; Hemoglobin 12.8; Platelets 195; Potassium 3.9; Sodium 140; TSH 0.915  Recent Lipid Panel    Component Value Date/Time   CHOL 138 08/14/2020 1615   TRIG 154 (H) 08/14/2020 1615   HDL 26 (L) 08/14/2020 1615   CHOLHDL 5.3 (H) 08/14/2020 1615   LDLCALC 85 08/14/2020 1615    Physical Exam:    VS:  BP 132/80    Pulse 76    Ht 5\' 8"  (1.727 m)    Wt 163 lb 12.8 oz (74.3 kg)    SpO2 97%    BMI 24.91 kg/m     Wt Readings from Last 3 Encounters:  04/12/21 163 lb 12.8 oz (74.3 kg)  08/14/20 169 lb 9.6 oz (76.9  kg)  02/05/20 178 lb 3.2 oz (80.8 kg)     GEN: Patient is in no acute distress HEENT: Normal NECK: No JVD; No carotid bruits LYMPHATICS: No lymphadenopathy CARDIAC: Hear sounds regular, 2/6 systolic murmur at the apex. RESPIRATORY:  Clear to auscultation without rales, wheezing or rhonchi  ABDOMEN: Soft, non-tender, non-distended MUSCULOSKELETAL:  No edema; No deformity  SKIN: Warm and dry NEUROLOGIC:  Alert and oriented x 3 PSYCHIATRIC:  Normal affect   Signed, Jenean Lindau, MD  04/12/2021 11:50 AM    [place]

## 2021-04-13 LAB — CBC WITH DIFFERENTIAL/PLATELET
Basophils Absolute: 0 10*3/uL (ref 0.0–0.2)
Basos: 1 %
EOS (ABSOLUTE): 0 10*3/uL (ref 0.0–0.4)
Eos: 1 %
Hematocrit: 37.9 % (ref 37.5–51.0)
Hemoglobin: 12.9 g/dL — ABNORMAL LOW (ref 13.0–17.7)
Immature Grans (Abs): 0 10*3/uL (ref 0.0–0.1)
Immature Granulocytes: 1 %
Lymphocytes Absolute: 1.8 10*3/uL (ref 0.7–3.1)
Lymphs: 27 %
MCH: 30.8 pg (ref 26.6–33.0)
MCHC: 34 g/dL (ref 31.5–35.7)
MCV: 91 fL (ref 79–97)
Monocytes Absolute: 0.5 10*3/uL (ref 0.1–0.9)
Monocytes: 7 %
Neutrophils Absolute: 4.2 10*3/uL (ref 1.4–7.0)
Neutrophils: 63 %
Platelets: 312 10*3/uL (ref 150–450)
RBC: 4.19 x10E6/uL (ref 4.14–5.80)
RDW: 13.3 % (ref 11.6–15.4)
WBC: 6.5 10*3/uL (ref 3.4–10.8)

## 2021-04-13 LAB — BASIC METABOLIC PANEL
BUN/Creatinine Ratio: 18 (ref 10–24)
BUN: 34 mg/dL — ABNORMAL HIGH (ref 8–27)
CO2: 28 mmol/L (ref 20–29)
Calcium: 10.3 mg/dL — ABNORMAL HIGH (ref 8.6–10.2)
Chloride: 100 mmol/L (ref 96–106)
Creatinine, Ser: 1.91 mg/dL — ABNORMAL HIGH (ref 0.76–1.27)
Glucose: 79 mg/dL (ref 70–99)
Potassium: 4.2 mmol/L (ref 3.5–5.2)
Sodium: 141 mmol/L (ref 134–144)
eGFR: 34 mL/min/{1.73_m2} — ABNORMAL LOW (ref >59)

## 2021-04-13 LAB — HEPATIC FUNCTION PANEL
ALT: 11 IU/L (ref 0–44)
AST: 26 IU/L (ref 0–40)
Albumin: 4.4 g/dL (ref 3.6–4.6)
Alkaline Phosphatase: 53 IU/L (ref 44–121)
Bilirubin Total: 0.6 mg/dL (ref 0.0–1.2)
Bilirubin, Direct: 0.34 mg/dL (ref 0.00–0.40)
Total Protein: 7.6 g/dL (ref 6.0–8.5)

## 2021-04-13 LAB — LIPID PANEL
Chol/HDL Ratio: 4.6 ratio (ref 0.0–5.0)
Cholesterol, Total: 139 mg/dL (ref 100–199)
HDL: 30 mg/dL — ABNORMAL LOW (ref >39)
LDL Chol Calc (NIH): 81 mg/dL (ref 0–99)
Triglycerides: 163 mg/dL — ABNORMAL HIGH (ref 0–149)
VLDL Cholesterol Cal: 28 mg/dL (ref 5–40)

## 2021-04-13 LAB — VITAMIN D 25 HYDROXY (VIT D DEFICIENCY, FRACTURES): Vit D, 25-Hydroxy: 74.8 ng/mL (ref 30.0–100.0)

## 2021-04-13 LAB — TSH: TSH: 0.174 u[IU]/mL — ABNORMAL LOW (ref 0.450–4.500)

## 2021-04-21 DIAGNOSIS — M1A9XX1 Chronic gout, unspecified, with tophus (tophi): Secondary | ICD-10-CM | POA: Diagnosis not present

## 2021-04-23 DIAGNOSIS — M1A9XX1 Chronic gout, unspecified, with tophus (tophi): Secondary | ICD-10-CM | POA: Diagnosis not present

## 2021-04-27 DIAGNOSIS — I10 Essential (primary) hypertension: Secondary | ICD-10-CM | POA: Diagnosis not present

## 2021-04-27 DIAGNOSIS — E782 Mixed hyperlipidemia: Secondary | ICD-10-CM | POA: Diagnosis not present

## 2021-05-05 DIAGNOSIS — M1A9XX1 Chronic gout, unspecified, with tophus (tophi): Secondary | ICD-10-CM | POA: Diagnosis not present

## 2021-05-07 DIAGNOSIS — N184 Chronic kidney disease, stage 4 (severe): Secondary | ICD-10-CM | POA: Diagnosis not present

## 2021-05-07 DIAGNOSIS — M79676 Pain in unspecified toe(s): Secondary | ICD-10-CM | POA: Diagnosis not present

## 2021-05-07 DIAGNOSIS — M1A9XX1 Chronic gout, unspecified, with tophus (tophi): Secondary | ICD-10-CM | POA: Diagnosis not present

## 2021-05-07 DIAGNOSIS — Z79899 Other long term (current) drug therapy: Secondary | ICD-10-CM | POA: Diagnosis not present

## 2021-05-07 DIAGNOSIS — M7989 Other specified soft tissue disorders: Secondary | ICD-10-CM | POA: Diagnosis not present

## 2021-05-07 DIAGNOSIS — M109 Gout, unspecified: Secondary | ICD-10-CM | POA: Diagnosis not present

## 2021-05-19 DIAGNOSIS — M1A9XX1 Chronic gout, unspecified, with tophus (tophi): Secondary | ICD-10-CM | POA: Diagnosis not present

## 2021-05-19 DIAGNOSIS — Z79899 Other long term (current) drug therapy: Secondary | ICD-10-CM | POA: Diagnosis not present

## 2021-05-21 DIAGNOSIS — M1A9XX1 Chronic gout, unspecified, with tophus (tophi): Secondary | ICD-10-CM | POA: Diagnosis not present

## 2021-06-02 DIAGNOSIS — M1A9XX1 Chronic gout, unspecified, with tophus (tophi): Secondary | ICD-10-CM | POA: Diagnosis not present

## 2021-06-04 DIAGNOSIS — M1A9XX1 Chronic gout, unspecified, with tophus (tophi): Secondary | ICD-10-CM | POA: Diagnosis not present

## 2021-06-16 DIAGNOSIS — M1A9XX1 Chronic gout, unspecified, with tophus (tophi): Secondary | ICD-10-CM | POA: Diagnosis not present

## 2021-06-18 DIAGNOSIS — M1A9XX1 Chronic gout, unspecified, with tophus (tophi): Secondary | ICD-10-CM | POA: Diagnosis not present

## 2021-07-01 ENCOUNTER — Other Ambulatory Visit: Payer: Self-pay | Admitting: Cardiology

## 2021-07-02 NOTE — Telephone Encounter (Signed)
Prescription refill request for Xarelto received.  ?Indication:Afib ?Last office visit:1/23 ?Weight:74.3 kg ?Age:88 ?Scr:1.9 ?CrCl:29.87 ml/min ? ?Prescription refilled ? ?

## 2021-07-07 DIAGNOSIS — I10 Essential (primary) hypertension: Secondary | ICD-10-CM | POA: Diagnosis not present

## 2021-07-07 DIAGNOSIS — E559 Vitamin D deficiency, unspecified: Secondary | ICD-10-CM | POA: Diagnosis not present

## 2021-07-07 DIAGNOSIS — E039 Hypothyroidism, unspecified: Secondary | ICD-10-CM | POA: Diagnosis not present

## 2021-07-07 DIAGNOSIS — M1A9XX1 Chronic gout, unspecified, with tophus (tophi): Secondary | ICD-10-CM | POA: Diagnosis not present

## 2021-07-07 DIAGNOSIS — R739 Hyperglycemia, unspecified: Secondary | ICD-10-CM | POA: Diagnosis not present

## 2021-07-07 DIAGNOSIS — E782 Mixed hyperlipidemia: Secondary | ICD-10-CM | POA: Diagnosis not present

## 2021-07-09 DIAGNOSIS — M1A9XX1 Chronic gout, unspecified, with tophus (tophi): Secondary | ICD-10-CM | POA: Diagnosis not present

## 2021-07-15 DIAGNOSIS — N289 Disorder of kidney and ureter, unspecified: Secondary | ICD-10-CM | POA: Diagnosis not present

## 2021-07-15 DIAGNOSIS — E039 Hypothyroidism, unspecified: Secondary | ICD-10-CM | POA: Diagnosis not present

## 2021-07-21 DIAGNOSIS — M1A9XX1 Chronic gout, unspecified, with tophus (tophi): Secondary | ICD-10-CM | POA: Diagnosis not present

## 2021-07-23 DIAGNOSIS — M1A9XX1 Chronic gout, unspecified, with tophus (tophi): Secondary | ICD-10-CM | POA: Diagnosis not present

## 2021-08-04 DIAGNOSIS — M1A9XX1 Chronic gout, unspecified, with tophus (tophi): Secondary | ICD-10-CM | POA: Diagnosis not present

## 2021-08-06 DIAGNOSIS — M1A9XX1 Chronic gout, unspecified, with tophus (tophi): Secondary | ICD-10-CM | POA: Diagnosis not present

## 2021-08-18 DIAGNOSIS — M1A9XX1 Chronic gout, unspecified, with tophus (tophi): Secondary | ICD-10-CM | POA: Diagnosis not present

## 2021-08-20 DIAGNOSIS — M1A9XX1 Chronic gout, unspecified, with tophus (tophi): Secondary | ICD-10-CM | POA: Diagnosis not present

## 2021-09-01 DIAGNOSIS — M1A9XX1 Chronic gout, unspecified, with tophus (tophi): Secondary | ICD-10-CM | POA: Diagnosis not present

## 2021-09-03 DIAGNOSIS — N184 Chronic kidney disease, stage 4 (severe): Secondary | ICD-10-CM | POA: Diagnosis not present

## 2021-09-03 DIAGNOSIS — Z79899 Other long term (current) drug therapy: Secondary | ICD-10-CM | POA: Diagnosis not present

## 2021-09-03 DIAGNOSIS — M7989 Other specified soft tissue disorders: Secondary | ICD-10-CM | POA: Diagnosis not present

## 2021-09-03 DIAGNOSIS — M109 Gout, unspecified: Secondary | ICD-10-CM | POA: Diagnosis not present

## 2021-09-03 DIAGNOSIS — M1A9XX1 Chronic gout, unspecified, with tophus (tophi): Secondary | ICD-10-CM | POA: Diagnosis not present

## 2021-09-15 DIAGNOSIS — M109 Gout, unspecified: Secondary | ICD-10-CM | POA: Diagnosis not present

## 2021-09-15 DIAGNOSIS — M1A9XX1 Chronic gout, unspecified, with tophus (tophi): Secondary | ICD-10-CM | POA: Diagnosis not present

## 2021-09-17 DIAGNOSIS — M1A9XX1 Chronic gout, unspecified, with tophus (tophi): Secondary | ICD-10-CM | POA: Diagnosis not present

## 2021-09-29 DIAGNOSIS — M1A9XX1 Chronic gout, unspecified, with tophus (tophi): Secondary | ICD-10-CM | POA: Diagnosis not present

## 2021-10-01 DIAGNOSIS — M1A9XX1 Chronic gout, unspecified, with tophus (tophi): Secondary | ICD-10-CM | POA: Diagnosis not present

## 2021-10-11 DIAGNOSIS — I10 Essential (primary) hypertension: Secondary | ICD-10-CM | POA: Diagnosis not present

## 2021-10-11 DIAGNOSIS — I4891 Unspecified atrial fibrillation: Secondary | ICD-10-CM | POA: Diagnosis not present

## 2021-10-11 DIAGNOSIS — D649 Anemia, unspecified: Secondary | ICD-10-CM | POA: Diagnosis not present

## 2021-10-11 DIAGNOSIS — R600 Localized edema: Secondary | ICD-10-CM | POA: Diagnosis not present

## 2021-10-11 DIAGNOSIS — R739 Hyperglycemia, unspecified: Secondary | ICD-10-CM | POA: Diagnosis not present

## 2021-10-11 DIAGNOSIS — M2042 Other hammer toe(s) (acquired), left foot: Secondary | ICD-10-CM | POA: Diagnosis not present

## 2021-10-11 DIAGNOSIS — E039 Hypothyroidism, unspecified: Secondary | ICD-10-CM | POA: Diagnosis not present

## 2021-10-11 DIAGNOSIS — M2041 Other hammer toe(s) (acquired), right foot: Secondary | ICD-10-CM | POA: Diagnosis not present

## 2021-10-11 DIAGNOSIS — J309 Allergic rhinitis, unspecified: Secondary | ICD-10-CM | POA: Diagnosis not present

## 2021-10-11 DIAGNOSIS — N289 Disorder of kidney and ureter, unspecified: Secondary | ICD-10-CM | POA: Diagnosis not present

## 2021-10-12 DIAGNOSIS — Z8739 Personal history of other diseases of the musculoskeletal system and connective tissue: Secondary | ICD-10-CM | POA: Diagnosis not present

## 2021-10-12 DIAGNOSIS — D649 Anemia, unspecified: Secondary | ICD-10-CM | POA: Diagnosis not present

## 2021-10-12 DIAGNOSIS — E559 Vitamin D deficiency, unspecified: Secondary | ICD-10-CM | POA: Diagnosis not present

## 2021-10-12 DIAGNOSIS — E039 Hypothyroidism, unspecified: Secondary | ICD-10-CM | POA: Diagnosis not present

## 2021-10-12 DIAGNOSIS — Z79899 Other long term (current) drug therapy: Secondary | ICD-10-CM | POA: Diagnosis not present

## 2021-10-12 DIAGNOSIS — K4090 Unilateral inguinal hernia, without obstruction or gangrene, not specified as recurrent: Secondary | ICD-10-CM | POA: Diagnosis not present

## 2021-10-12 DIAGNOSIS — R739 Hyperglycemia, unspecified: Secondary | ICD-10-CM | POA: Diagnosis not present

## 2021-10-12 DIAGNOSIS — N289 Disorder of kidney and ureter, unspecified: Secondary | ICD-10-CM | POA: Diagnosis not present

## 2021-10-15 DIAGNOSIS — M1A9XX1 Chronic gout, unspecified, with tophus (tophi): Secondary | ICD-10-CM | POA: Diagnosis not present

## 2021-10-27 DIAGNOSIS — M1A9XX1 Chronic gout, unspecified, with tophus (tophi): Secondary | ICD-10-CM | POA: Diagnosis not present

## 2021-10-29 DIAGNOSIS — M1A9XX1 Chronic gout, unspecified, with tophus (tophi): Secondary | ICD-10-CM | POA: Diagnosis not present

## 2021-11-03 DIAGNOSIS — K4090 Unilateral inguinal hernia, without obstruction or gangrene, not specified as recurrent: Secondary | ICD-10-CM

## 2021-11-03 HISTORY — DX: Unilateral inguinal hernia, without obstruction or gangrene, not specified as recurrent: K40.90

## 2021-11-10 ENCOUNTER — Other Ambulatory Visit: Payer: Self-pay

## 2021-11-10 DIAGNOSIS — M1A9XX1 Chronic gout, unspecified, with tophus (tophi): Secondary | ICD-10-CM | POA: Diagnosis not present

## 2021-11-11 ENCOUNTER — Encounter: Payer: Self-pay | Admitting: Cardiology

## 2021-11-11 ENCOUNTER — Ambulatory Visit (INDEPENDENT_AMBULATORY_CARE_PROVIDER_SITE_OTHER): Payer: Medicare Other | Admitting: Cardiology

## 2021-11-11 VITALS — BP 108/73 | HR 102 | Ht 65.0 in | Wt 151.8 lb

## 2021-11-11 DIAGNOSIS — N189 Chronic kidney disease, unspecified: Secondary | ICD-10-CM

## 2021-11-11 DIAGNOSIS — I4821 Permanent atrial fibrillation: Secondary | ICD-10-CM

## 2021-11-11 DIAGNOSIS — I10 Essential (primary) hypertension: Secondary | ICD-10-CM | POA: Diagnosis not present

## 2021-11-11 DIAGNOSIS — E782 Mixed hyperlipidemia: Secondary | ICD-10-CM

## 2021-11-11 HISTORY — DX: Permanent atrial fibrillation: I48.21

## 2021-11-11 MED ORDER — AMLODIPINE BESYLATE 5 MG PO TABS: 5.0000 mg | ORAL_TABLET | Freq: Every day | ORAL | 3 refills | Status: DC

## 2021-11-11 MED ORDER — ATENOLOL 25 MG PO TABS: ORAL_TABLET | ORAL | 3 refills | Status: DC

## 2021-11-11 NOTE — Patient Instructions (Signed)
Medication Instructions:  Your physician has recommended you make the following change in your medication:   Decrease your Amlodipine to 5 mg daily.  Increase your Atenolol to 50 mg in AM and 25 mg in PM.  *If you need a refill on your cardiac medications before your next appointment, please call your pharmacy*   Lab Work: None ordered If you have labs (blood work) drawn today and your tests are completely normal, you will receive your results only by: [place] (if you have MyChart) OR A paper copy in the mail If you have any lab test that is abnormal or we need to change your treatment, we will call you to review the results.   Testing/Procedures: None ordered   Follow-Up: At [facility], you and your health needs are our priority.  As part of our continuing mission to provide you with exceptional heart care, we have created designated Provider Care Teams.  These Care Teams include your primary Cardiologist (physician) and Advanced Practice Providers (APPs -  Physician Assistants and Nurse Practitioners) who all work together to provide you with the care you need, when you need it.  We recommend signing up for the patient portal called "MyChart".  Sign up information is provided on this After Visit Summary.  MyChart is used to connect with patients for Virtual Visits (Telemedicine).  Patients are able to view lab/test results, encounter notes, upcoming appointments, etc.  Non-urgent messages can be sent to your provider as well.   To learn more about what you can do with MyChart, go to [email or web address].    Your next appointment:   6 month(s)  The format for your next appointment:   In Person  Provider:   Jyl Heinz, MD   Other Instructions NA

## 2021-11-11 NOTE — Progress Notes (Signed)
Cardiology Office Note:    Date:  11/11/2021   ID:  Robert Chapman, DOB Jul 10, 1933, MRN [id number]  PCP:  Nicholos Johns, MD  Cardiologist:  Jenean Lindau, MD   Referring MD: Nicholos Johns, MD    ASSESSMENT:    1. Essential hypertension   2. Mixed dyslipidemia   3. Permanent atrial fibrillation ([place])   4. Chronic renal impairment, unspecified CKD stage    PLAN:    In order of problems listed above:  Primary prevention stressed with the patient.  Importance of compliance with diet medication stressed any vocalized understanding. Preoperative cardiovascular risk stratification: Patient has good effort tolerance.  He ambulates with a cane.  In view of this I feel he is not at high risk for coronary events during the aforementioned surgery.  Meticulous hemodynamic monitoring and continued perioperative beta-blockade will further reduce the risk of coronary events. Atrial fibrillation permanent:I discussed with the patient atrial fibrillation, disease process. Management and therapy including rate and rhythm control, anticoagulation benefits and potential risks were discussed extensively with the patient. Patient had multiple questions which were answered to patient's satisfaction.  I told him the benefits risks of withholding anticoagulation and he is okay with that.  He will be managed for this by our surgical colleagues.  And anticoagulation can be resumed depending felt appropriate by Dr. Lovie Macadamia.  In view of elevated heart rate I increased beta-blocker to 50 mg in the morning.  Also blood pressure is borderline so have cut down amlodipine to 5 mg daily.  He will keep a track of heart rate and blood pressure and send it to [place] in the next few days. Mixed dyslipidemia: On statins followed by primary care. Patient will be seen in follow-up appointment in 6 months or earlier if the patient has any concerns    Medication Adjustments/Labs and Tests Ordered: Current medicines are reviewed at  length with the patient today.  Concerns regarding medicines are outlined above.  No orders of the defined types were placed in this encounter.  No orders of the defined types were placed in this encounter.    No chief complaint on file.    History of Present Illness:    Robert Chapman is a 88 y.o. male.  Patient has past medical history of permanent essential hypertension, mixed dyslipidemia and renal insufficiency.  He denies any problems at this time and takes care of activities of daily living.  He is planning to undergo hernia surgery.  His daughter-in-law accompanies him for this visit.  She mentions to me that he is active he walks with a cane.  When he goes to her place he walks significant distances to the leg without any symptoms and he is happy about it.  At the time of my evaluation, the patient is alert awake oriented and in no distress.  Past Medical History:  Diagnosis Date   ACUTE KIDNEY FAILURE UNSPECIFIED 06/08/2010   Qualifier: Diagnosis of  By: Chase Caller MD, Murali     ACUTE RESPIRATORY FAILURE 06/08/2010   Qualifier: Diagnosis of  By: Chase Caller MD, [place] CHRONIC KIDNEY DISEASE 06/08/2010   Qualifier: Diagnosis of  By: Chase Caller MD, Murali     CRI (chronic renal insufficiency) 10/08/2014   Essential hypertension 10/08/2014   Gout 11/05/2020   Left inguinal hernia 11/03/2021   Mixed dyslipidemia 08/14/2020   MYOCARDIAL INFARCTION 06/08/2010   Qualifier: History of  By: Charma Igo     Old myocardial infarction  06/08/2010   Qualifier: History of  By: Charma Igo     PAF (paroxysmal atrial fibrillation) ([place]) 10/08/2014   Pure hypercholesterolemia 10/08/2014    Past Surgical History:  Procedure Laterality Date   heart surgery      Current Medications: Current Meds  Medication Sig   amLODipine (NORVASC) 10 MG tablet Take 10 mg by mouth daily.   atenolol (TENORMIN) 25 MG tablet Take 1 tablet (25 mg total) by mouth 2 (two) times daily.    fenofibrate 160 MG tablet TAKE 1 TABLET BY MOUTH  DAILY   furosemide (LASIX) 20 MG tablet Take 20 mg by mouth daily.   levothyroxine (SYNTHROID) 88 MCG tablet Take 88 mcg by mouth daily.   Multiple Vitamin (MULTIVITAMIN PO) Take 1 tablet by mouth daily.   mycophenolate (CELLCEPT) 500 MG tablet Take 500 mg by mouth 2 (two) times daily.   Niacinamide-Zn-Cu-Methfo-Se-Cr (NICOTINAMIDE PO) Take 500 mg by mouth every evening.   potassium chloride (KLOR-CON M) 10 MEQ tablet Take 10 mEq by mouth daily.   pravastatin (PRAVACHOL) 40 MG tablet Take 40 mg by mouth daily.   tamsulosin (FLOMAX) 0.4 MG CAPS capsule Take 0.4 mg by mouth daily.   XARELTO 15 MG TABS tablet TAKE 1 TABLET BY MOUTH  DAILY WITH SUPPER     Allergies:   Patient has no known allergies.   Social History   Socioeconomic History   Marital status: Married    Spouse name: Not on file   Number of children: Not on file   Years of education: Not on file   Highest education level: Not on file  Occupational History   Not on file  Tobacco Use   Smoking status: Never   Smokeless tobacco: Never  Substance and Sexual Activity   Alcohol use: Not on file   Drug use: Not on file   Sexual activity: Not on file  Other Topics Concern   Not on file  Social History Narrative   Not on file   Social Determinants of Health   Financial Resource Strain: Not on file  Food Insecurity: Not on file  Transportation Needs: Not on file  Physical Activity: Not on file  Stress: Not on file  Social Connections: Not on file     Family History: The patient's family history includes Cancer in his father; Hypertension in his mother.  ROS:   Please see the history of present illness.    All other systems reviewed and are negative.  EKGs/Labs/Other Studies Reviewed:    The following studies were reviewed today: EKG reveals atrial fibrillation   Recent Labs: 04/12/2021: ALT 11; BUN 34; Creatinine, Ser 1.91; Hemoglobin 12.9; Platelets 312;  Potassium 4.2; Sodium 141; TSH 0.174  Recent Lipid Panel    Component Value Date/Time   CHOL 139 04/12/2021 1159   TRIG 163 (H) 04/12/2021 1159   HDL 30 (L) 04/12/2021 1159   CHOLHDL 4.6 04/12/2021 1159   LDLCALC 81 04/12/2021 1159    Physical Exam:    VS:  BP 108/73   Pulse (!) 102   Ht '5\' 5"'$  (1.651 m)   Wt 151 lb 12.8 oz (68.9 kg)   SpO2 95%   BMI 25.26 kg/m     Wt Readings from Last 3 Encounters:  11/11/21 151 lb 12.8 oz (68.9 kg)  04/12/21 163 lb 12.8 oz (74.3 kg)  08/14/20 169 lb 9.6 oz (76.9 kg)     GEN: Patient is in no acute distress HEENT: Normal NECK: No JVD;  No carotid bruits LYMPHATICS: No lymphadenopathy CARDIAC: Hear sounds regular, 2/6 systolic murmur at the apex. RESPIRATORY:  Clear to auscultation without rales, wheezing or rhonchi  ABDOMEN: Soft, non-tender, non-distended MUSCULOSKELETAL:  No edema; No deformity  SKIN: Warm and dry NEUROLOGIC:  Alert and oriented x 3 PSYCHIATRIC:  Normal affect   Signed, Jenean Lindau, MD  11/11/2021 1:38 PM    [facility]

## 2021-11-12 DIAGNOSIS — M1A9XX1 Chronic gout, unspecified, with tophus (tophi): Secondary | ICD-10-CM | POA: Diagnosis not present

## 2021-11-18 ENCOUNTER — Telehealth: Payer: Self-pay | Admitting: *Deleted

## 2021-11-18 NOTE — Patient Outreach (Signed)
  Care Coordination   Initial Visit Note   11/18/2021 Name: Robert Chapman MRN: [id number] DOB: Nov 09, 1933  Robert Chapman is a 88 y.o. year old male who sees Nicholos Johns, MD for primary care. I spoke with  daughter of Robert Chapman by phone today  What matters to the patients health and wellness today?  Declines Care Coordination at this time. Reminded them of AWV also.    Goals Addressed   None     SDOH assessments and interventions completed:  No     Care Coordination Interventions Activated:  No  Care Coordination Interventions:  No, not indicated   Follow up plan: No further intervention required.   Encounter Outcome:  Pt. Refused

## 2021-11-19 DIAGNOSIS — I4891 Unspecified atrial fibrillation: Secondary | ICD-10-CM | POA: Diagnosis not present

## 2021-11-19 DIAGNOSIS — Z9981 Dependence on supplemental oxygen: Secondary | ICD-10-CM | POA: Diagnosis not present

## 2021-11-19 DIAGNOSIS — Z951 Presence of aortocoronary bypass graft: Secondary | ICD-10-CM | POA: Diagnosis not present

## 2021-11-19 DIAGNOSIS — K4090 Unilateral inguinal hernia, without obstruction or gangrene, not specified as recurrent: Secondary | ICD-10-CM | POA: Diagnosis not present

## 2021-11-19 DIAGNOSIS — Z7901 Long term (current) use of anticoagulants: Secondary | ICD-10-CM | POA: Diagnosis not present

## 2021-11-19 DIAGNOSIS — G8918 Other acute postprocedural pain: Secondary | ICD-10-CM | POA: Diagnosis not present

## 2021-11-19 DIAGNOSIS — Z955 Presence of coronary angioplasty implant and graft: Secondary | ICD-10-CM | POA: Diagnosis not present

## 2021-11-19 DIAGNOSIS — E039 Hypothyroidism, unspecified: Secondary | ICD-10-CM | POA: Diagnosis not present

## 2021-11-19 DIAGNOSIS — E119 Type 2 diabetes mellitus without complications: Secondary | ICD-10-CM | POA: Diagnosis not present

## 2021-11-19 DIAGNOSIS — J449 Chronic obstructive pulmonary disease, unspecified: Secondary | ICD-10-CM | POA: Diagnosis not present

## 2021-11-19 DIAGNOSIS — I2510 Atherosclerotic heart disease of native coronary artery without angina pectoris: Secondary | ICD-10-CM | POA: Diagnosis not present

## 2021-11-19 DIAGNOSIS — I252 Old myocardial infarction: Secondary | ICD-10-CM | POA: Diagnosis not present

## 2021-11-20 DIAGNOSIS — Z7901 Long term (current) use of anticoagulants: Secondary | ICD-10-CM | POA: Diagnosis not present

## 2021-11-20 DIAGNOSIS — Z951 Presence of aortocoronary bypass graft: Secondary | ICD-10-CM | POA: Diagnosis not present

## 2021-11-20 DIAGNOSIS — E119 Type 2 diabetes mellitus without complications: Secondary | ICD-10-CM | POA: Diagnosis not present

## 2021-11-20 DIAGNOSIS — I2510 Atherosclerotic heart disease of native coronary artery without angina pectoris: Secondary | ICD-10-CM | POA: Diagnosis not present

## 2021-11-20 DIAGNOSIS — K4090 Unilateral inguinal hernia, without obstruction or gangrene, not specified as recurrent: Secondary | ICD-10-CM | POA: Diagnosis not present

## 2021-11-20 DIAGNOSIS — G8918 Other acute postprocedural pain: Secondary | ICD-10-CM | POA: Diagnosis not present

## 2021-11-20 DIAGNOSIS — J449 Chronic obstructive pulmonary disease, unspecified: Secondary | ICD-10-CM | POA: Diagnosis not present

## 2021-11-20 DIAGNOSIS — Z9981 Dependence on supplemental oxygen: Secondary | ICD-10-CM | POA: Diagnosis not present

## 2021-11-20 DIAGNOSIS — Z955 Presence of coronary angioplasty implant and graft: Secondary | ICD-10-CM | POA: Diagnosis not present

## 2021-11-24 DIAGNOSIS — M1A9XX1 Chronic gout, unspecified, with tophus (tophi): Secondary | ICD-10-CM | POA: Diagnosis not present

## 2021-11-26 DIAGNOSIS — M1A9XX1 Chronic gout, unspecified, with tophus (tophi): Secondary | ICD-10-CM | POA: Diagnosis not present

## 2021-12-08 DIAGNOSIS — M1A9XX1 Chronic gout, unspecified, with tophus (tophi): Secondary | ICD-10-CM | POA: Diagnosis not present

## 2021-12-10 DIAGNOSIS — M1A9XX1 Chronic gout, unspecified, with tophus (tophi): Secondary | ICD-10-CM | POA: Diagnosis not present

## 2021-12-22 DIAGNOSIS — M1A9XX1 Chronic gout, unspecified, with tophus (tophi): Secondary | ICD-10-CM | POA: Diagnosis not present

## 2021-12-24 DIAGNOSIS — M7989 Other specified soft tissue disorders: Secondary | ICD-10-CM | POA: Diagnosis not present

## 2021-12-24 DIAGNOSIS — N184 Chronic kidney disease, stage 4 (severe): Secondary | ICD-10-CM | POA: Diagnosis not present

## 2021-12-24 DIAGNOSIS — M109 Gout, unspecified: Secondary | ICD-10-CM | POA: Diagnosis not present

## 2021-12-24 DIAGNOSIS — M1A9XX1 Chronic gout, unspecified, with tophus (tophi): Secondary | ICD-10-CM | POA: Diagnosis not present

## 2021-12-24 DIAGNOSIS — Z79899 Other long term (current) drug therapy: Secondary | ICD-10-CM | POA: Diagnosis not present

## 2022-01-05 DIAGNOSIS — M109 Gout, unspecified: Secondary | ICD-10-CM | POA: Diagnosis not present

## 2022-01-07 DIAGNOSIS — M1A9XX1 Chronic gout, unspecified, with tophus (tophi): Secondary | ICD-10-CM | POA: Diagnosis not present

## 2022-01-14 ENCOUNTER — Other Ambulatory Visit: Payer: Self-pay | Admitting: Cardiology

## 2022-01-19 DIAGNOSIS — M1A9XX1 Chronic gout, unspecified, with tophus (tophi): Secondary | ICD-10-CM | POA: Diagnosis not present

## 2022-01-21 DIAGNOSIS — M1A9XX1 Chronic gout, unspecified, with tophus (tophi): Secondary | ICD-10-CM | POA: Diagnosis not present

## 2022-01-24 DIAGNOSIS — N289 Disorder of kidney and ureter, unspecified: Secondary | ICD-10-CM | POA: Diagnosis not present

## 2022-01-24 DIAGNOSIS — E039 Hypothyroidism, unspecified: Secondary | ICD-10-CM | POA: Diagnosis not present

## 2022-01-24 DIAGNOSIS — I4891 Unspecified atrial fibrillation: Secondary | ICD-10-CM | POA: Diagnosis not present

## 2022-01-24 DIAGNOSIS — M2041 Other hammer toe(s) (acquired), right foot: Secondary | ICD-10-CM | POA: Diagnosis not present

## 2022-01-24 DIAGNOSIS — D649 Anemia, unspecified: Secondary | ICD-10-CM | POA: Diagnosis not present

## 2022-01-24 DIAGNOSIS — M2042 Other hammer toe(s) (acquired), left foot: Secondary | ICD-10-CM | POA: Diagnosis not present

## 2022-01-24 DIAGNOSIS — I10 Essential (primary) hypertension: Secondary | ICD-10-CM | POA: Diagnosis not present

## 2022-01-24 DIAGNOSIS — R739 Hyperglycemia, unspecified: Secondary | ICD-10-CM | POA: Diagnosis not present

## 2022-01-24 DIAGNOSIS — J309 Allergic rhinitis, unspecified: Secondary | ICD-10-CM | POA: Diagnosis not present

## 2022-01-24 DIAGNOSIS — R600 Localized edema: Secondary | ICD-10-CM | POA: Diagnosis not present

## 2022-02-01 ENCOUNTER — Ambulatory Visit: Payer: Medicare Other | Admitting: Podiatry

## 2022-02-01 DIAGNOSIS — M79675 Pain in left toe(s): Secondary | ICD-10-CM | POA: Diagnosis not present

## 2022-02-01 DIAGNOSIS — M79674 Pain in right toe(s): Secondary | ICD-10-CM | POA: Diagnosis not present

## 2022-02-01 DIAGNOSIS — B351 Tinea unguium: Secondary | ICD-10-CM

## 2022-02-01 NOTE — Progress Notes (Signed)
  Subjective:  Patient ID: Robert Chapman, male    DOB: 1933/06/25,  MRN: [id number]  Chief Complaint  Patient presents with   Nail Problem    RFC nail trim    88 y.o. male presents with the above complaint. History confirmed with patient. Patient presenting with pain related to dystrophic thickened elongated nails. Patient is unable to trim own nails related to nail dystrophy and/or mobility issues. Patient does not have a history of T2DM.   Objective:  Physical Exam: warm, good capillary refill nail exam onychomycosis of the toenails, onycholysis, and dystrophic nails DP pulses palpable, PT pulses palpable, and protective sensation intact Left Foot:  Pain with palpation of nails due to elongation and dystrophic growth.  Right Foot: Pain with palpation of nails due to elongation and dystrophic growth.   Assessment:   1. Pain due to onychomycosis of toenails of both feet      Plan:  Patient was evaluated and treated and all questions answered.  #Onychomycosis with pain  -Nails palliatively debrided as below. -Educated on self-care  Procedure: Nail Debridement Rationale: Pain Type of Debridement: manual, sharp debridement. Instrumentation: Nail nipper, rotary burr. Number of Nails: 10  Return in about 3 months (around 05/04/2022) for RFC.         Everitt Amber, DPM [facility]

## 2022-02-02 DIAGNOSIS — M1A9XX1 Chronic gout, unspecified, with tophus (tophi): Secondary | ICD-10-CM | POA: Diagnosis not present

## 2022-02-04 DIAGNOSIS — M1A9XX1 Chronic gout, unspecified, with tophus (tophi): Secondary | ICD-10-CM | POA: Diagnosis not present

## 2022-02-23 DIAGNOSIS — M1A9XX1 Chronic gout, unspecified, with tophus (tophi): Secondary | ICD-10-CM | POA: Diagnosis not present

## 2022-02-25 DIAGNOSIS — M1A9XX1 Chronic gout, unspecified, with tophus (tophi): Secondary | ICD-10-CM | POA: Diagnosis not present

## 2022-03-02 ENCOUNTER — Telehealth: Payer: Self-pay | Admitting: Cardiology

## 2022-03-02 MED ORDER — RIVAROXABAN 15 MG PO TABS: 15.0000 mg | ORAL_TABLET | Freq: Every day | ORAL | 1 refills | Status: DC

## 2022-03-02 NOTE — Telephone Encounter (Signed)
*  STAT* If patient is at the pharmacy, call can be transferred to refill team.   1. Which medications need to be refilled? (please list name of each medication and dose if known)   XARELTO 15 MG TABS tablet    2. Which pharmacy/location (including street and city if local pharmacy) is medication to be sent to? [facility] ([place], [place]   3. Do they need a 30 day or 90 day supply?  90 day

## 2022-03-02 NOTE — Telephone Encounter (Signed)
Prescription refill request for Xarelto received.  Indication: PAF Last office visit: 11/11/21  R Revankar MD Weight: 68.9 Age: 88 Scr:  1.89 on 01/05/22 CrCl: 27.34  Based on above findings Xarelto '15mg'$  daily is the appropriate dose.  Refill approved.

## 2022-03-09 DIAGNOSIS — M1A9XX1 Chronic gout, unspecified, with tophus (tophi): Secondary | ICD-10-CM | POA: Diagnosis not present

## 2022-03-10 DIAGNOSIS — E039 Hypothyroidism, unspecified: Secondary | ICD-10-CM | POA: Diagnosis not present

## 2022-03-10 DIAGNOSIS — E559 Vitamin D deficiency, unspecified: Secondary | ICD-10-CM | POA: Diagnosis not present

## 2022-03-10 DIAGNOSIS — R739 Hyperglycemia, unspecified: Secondary | ICD-10-CM | POA: Diagnosis not present

## 2022-03-10 DIAGNOSIS — D649 Anemia, unspecified: Secondary | ICD-10-CM | POA: Diagnosis not present

## 2022-03-10 DIAGNOSIS — H68003 Unspecified Eustachian salpingitis, bilateral: Secondary | ICD-10-CM | POA: Diagnosis not present

## 2022-03-10 DIAGNOSIS — R509 Fever, unspecified: Secondary | ICD-10-CM | POA: Diagnosis not present

## 2022-03-10 DIAGNOSIS — Z6824 Body mass index (BMI) 24.0-24.9, adult: Secondary | ICD-10-CM | POA: Diagnosis not present

## 2022-03-10 DIAGNOSIS — N289 Disorder of kidney and ureter, unspecified: Secondary | ICD-10-CM | POA: Diagnosis not present

## 2022-03-10 DIAGNOSIS — Z79899 Other long term (current) drug therapy: Secondary | ICD-10-CM | POA: Diagnosis not present

## 2022-03-11 DIAGNOSIS — M1A9XX1 Chronic gout, unspecified, with tophus (tophi): Secondary | ICD-10-CM | POA: Diagnosis not present

## 2022-03-23 DIAGNOSIS — M1A9XX1 Chronic gout, unspecified, with tophus (tophi): Secondary | ICD-10-CM | POA: Diagnosis not present

## 2022-03-25 DIAGNOSIS — N184 Chronic kidney disease, stage 4 (severe): Secondary | ICD-10-CM | POA: Diagnosis not present

## 2022-03-25 DIAGNOSIS — Z79899 Other long term (current) drug therapy: Secondary | ICD-10-CM | POA: Diagnosis not present

## 2022-03-25 DIAGNOSIS — M1A9XX1 Chronic gout, unspecified, with tophus (tophi): Secondary | ICD-10-CM | POA: Diagnosis not present

## 2022-03-25 DIAGNOSIS — M7989 Other specified soft tissue disorders: Secondary | ICD-10-CM | POA: Diagnosis not present

## 2022-03-25 DIAGNOSIS — M109 Gout, unspecified: Secondary | ICD-10-CM | POA: Diagnosis not present

## 2022-05-02 DIAGNOSIS — R739 Hyperglycemia, unspecified: Secondary | ICD-10-CM | POA: Diagnosis not present

## 2022-05-02 DIAGNOSIS — I4891 Unspecified atrial fibrillation: Secondary | ICD-10-CM | POA: Diagnosis not present

## 2022-05-02 DIAGNOSIS — E039 Hypothyroidism, unspecified: Secondary | ICD-10-CM | POA: Diagnosis not present

## 2022-05-02 DIAGNOSIS — N289 Disorder of kidney and ureter, unspecified: Secondary | ICD-10-CM | POA: Diagnosis not present

## 2022-05-02 DIAGNOSIS — I10 Essential (primary) hypertension: Secondary | ICD-10-CM | POA: Diagnosis not present

## 2022-05-02 DIAGNOSIS — D649 Anemia, unspecified: Secondary | ICD-10-CM | POA: Diagnosis not present

## 2022-05-03 ENCOUNTER — Telehealth: Payer: Self-pay | Admitting: Podiatry

## 2022-05-03 NOTE — Telephone Encounter (Signed)
Left message for pts daughter to call to r/s appt for 2.9 as provider is out of the office for a conference. The message for pts daughter stated  appt was canceled and to call to reschedule.

## 2022-05-06 ENCOUNTER — Ambulatory Visit: Payer: Medicare Other | Admitting: Podiatry

## 2022-05-27 DIAGNOSIS — M1A9XX1 Chronic gout, unspecified, with tophus (tophi): Secondary | ICD-10-CM | POA: Diagnosis not present

## 2022-06-01 DIAGNOSIS — M109 Gout, unspecified: Secondary | ICD-10-CM | POA: Diagnosis not present

## 2022-06-01 DIAGNOSIS — Z79899 Other long term (current) drug therapy: Secondary | ICD-10-CM | POA: Diagnosis not present

## 2022-06-01 DIAGNOSIS — M1A9XX1 Chronic gout, unspecified, with tophus (tophi): Secondary | ICD-10-CM | POA: Diagnosis not present

## 2022-06-01 DIAGNOSIS — M7989 Other specified soft tissue disorders: Secondary | ICD-10-CM | POA: Diagnosis not present

## 2022-06-01 DIAGNOSIS — N184 Chronic kidney disease, stage 4 (severe): Secondary | ICD-10-CM | POA: Diagnosis not present

## 2022-07-05 DIAGNOSIS — C44329 Squamous cell carcinoma of skin of other parts of face: Secondary | ICD-10-CM | POA: Diagnosis not present

## 2022-07-05 DIAGNOSIS — C4442 Squamous cell carcinoma of skin of scalp and neck: Secondary | ICD-10-CM | POA: Diagnosis not present

## 2022-07-05 DIAGNOSIS — L578 Other skin changes due to chronic exposure to nonionizing radiation: Secondary | ICD-10-CM | POA: Diagnosis not present

## 2022-07-06 ENCOUNTER — Encounter: Payer: Self-pay | Admitting: Cardiology

## 2022-07-06 ENCOUNTER — Ambulatory Visit: Payer: Medicare Other | Attending: Cardiology | Admitting: Cardiology

## 2022-07-06 VITALS — BP 140/84 | HR 62 | Ht 67.0 in | Wt 157.2 lb

## 2022-07-06 DIAGNOSIS — I10 Essential (primary) hypertension: Secondary | ICD-10-CM

## 2022-07-06 DIAGNOSIS — N189 Chronic kidney disease, unspecified: Secondary | ICD-10-CM | POA: Diagnosis not present

## 2022-07-06 DIAGNOSIS — N289 Disorder of kidney and ureter, unspecified: Secondary | ICD-10-CM

## 2022-07-06 DIAGNOSIS — I4821 Permanent atrial fibrillation: Secondary | ICD-10-CM

## 2022-07-06 DIAGNOSIS — E782 Mixed hyperlipidemia: Secondary | ICD-10-CM | POA: Diagnosis not present

## 2022-07-06 MED ORDER — AMLODIPINE BESYLATE 5 MG PO TABS: 5.0000 mg | ORAL_TABLET | Freq: Every day | ORAL | 3 refills | Status: DC

## 2022-07-06 MED ORDER — ATENOLOL 25 MG PO TABS: ORAL_TABLET | ORAL | 3 refills | Status: DC

## 2022-07-06 MED ORDER — RIVAROXABAN 15 MG PO TABS: 15.0000 mg | ORAL_TABLET | Freq: Every day | ORAL | 3 refills | Status: DC

## 2022-07-06 NOTE — Patient Instructions (Signed)
Medication Instructions:  Your physician recommends that you continue on your current medications as directed. Please refer to the Current Medication list given to you today.  *If you need a refill on your cardiac medications before your next appointment, please call your pharmacy*   Lab Work: None ordered If you have labs (blood work) drawn today and your tests are completely normal, you will receive your results only by: MyChart Message (if you have MyChart) OR A paper copy in the mail If you have any lab test that is abnormal or we need to change your treatment, we will call you to review the results.   Testing/Procedures: None ordered   Follow-Up: At [facility], you and your health needs are our priority.  As part of our continuing mission to provide you with exceptional heart care, we have created designated Provider Care Teams.  These Care Teams include your primary Cardiologist (physician) and Advanced Practice Providers (APPs -  Physician Assistants and Nurse Practitioners) who all work together to provide you with the care you need, when you need it.  We recommend signing up for the patient portal called "MyChart".  Sign up information is provided on this After Visit Summary.  MyChart is used to connect with patients for Virtual Visits (Telemedicine).  Patients are able to view lab/test results, encounter notes, upcoming appointments, etc.  Non-urgent messages can be sent to your provider as well.   To learn more about what you can do with MyChart, go to [email or web address].    Your next appointment:   9 month(s)  The format for your next appointment:   In Person  Provider:   Rajan Revankar, MD    Other Instructions none  Important Information About Sugar       

## 2022-07-06 NOTE — Progress Notes (Signed)
Cardiology Office Note:    Date:  07/06/2022   ID:  Robert Chapman, DOB 1933/09/30, MRN [id number]  PCP:  Robert Lei, MD  Cardiologist:  Garwin Brothers, MD   Referring MD: Robert Lei, MD    ASSESSMENT:    1. Permanent atrial fibrillation   2. Essential hypertension   3. Chronic renal impairment, unspecified CKD stage   4. Mixed dyslipidemia    PLAN:    In order of problems listed above:  Primary prevention stressed with the patient.  Importance of compliance with diet medication stressed and she vocalized understanding. Permanent atrial fibrillation:I discussed with the patient atrial fibrillation, disease process. Management and therapy including rate and rhythm control, anticoagulation benefits and potential risks were discussed extensively with the patient. Patient had multiple questions which were answered to patient's satisfaction Essential hypertension: Blood pressure is stable and diet was emphasized.  Lifestyle modification urged. Mixed dyslipidemia: On lipid-lowering medications.  Lipids reviewed and discussed with patient and his son and questions were answered to his satisfaction. Patient will be seen in follow-up appointment in 6 months or earlier if the patient has any concerns.    Medication Adjustments/Labs and Tests Ordered: Current medicines are reviewed at length with the patient today.  Concerns regarding medicines are outlined above.  No orders of the defined types were placed in this encounter.  No orders of the defined types were placed in this encounter.    No chief complaint on file.    History of Present Illness:    Robert Chapman is a 89 y.o. male.  Patient has past medical history of permanent atrial fibrillation, essential hypertension and dyslipidemia.  He leads a sedentary lifestyle and ambulates age appropriately.  He is here for follow-up.  He denies any chest pain orthopnea PND or any syncopal episodes.  No history of palpitations.  At  the time of my evaluation, the patient is alert awake oriented and in no distress.  Past Medical History:  Diagnosis Date   ACUTE KIDNEY FAILURE UNSPECIFIED 06/08/2010   Qualifier: Diagnosis of  By: Marchelle Gearing MD, Murali     ACUTE RESPIRATORY FAILURE 06/08/2010   Qualifier: Diagnosis of  By: Marchelle Gearing MD, Murali     ANEMIA IN CHRONIC KIDNEY DISEASE 06/08/2010   Qualifier: Diagnosis of  By: Marchelle Gearing MD, Murali     CRI (chronic renal insufficiency) 10/08/2014   Essential hypertension 10/08/2014   Gout 11/05/2020   Left inguinal hernia 11/03/2021   Mixed dyslipidemia 08/14/2020   MYOCARDIAL INFARCTION 06/08/2010   Qualifier: History of  By: Carver Fila     Old myocardial infarction 06/08/2010   Qualifier: History of  By: Carver Fila     PAF (paroxysmal atrial fibrillation) 10/08/2014   Permanent atrial fibrillation 11/11/2021   Pure hypercholesterolemia 10/08/2014    Past Surgical History:  Procedure Laterality Date   heart surgery      Current Medications: Current Meds  Medication Sig   allopurinol (ZYLOPRIM) 100 MG tablet Take 100 mg by mouth daily.   amLODipine (NORVASC) 5 MG tablet Take 1 tablet (5 mg total) by mouth daily.   atenolol (TENORMIN) 25 MG tablet Take 50 mg (2 tablets) in the morning and 25 mg (1 tablet) in the evening.   fenofibrate 160 MG tablet Take 1 tablet (160 mg total) by mouth daily.   furosemide (LASIX) 20 MG tablet Take 20 mg by mouth daily.   levothyroxine (SYNTHROID) 88 MCG tablet Take 88 mcg by mouth daily.   Multiple  Vitamin (MULTIVITAMIN PO) Take 1 tablet by mouth daily.   mycophenolate (CELLCEPT) 500 MG tablet Take 500 mg by mouth 2 (two) times daily.   Niacinamide-Zn-Cu-Methfo-Se-Cr (NICOTINAMIDE PO) Take 500 mg by mouth every evening.   potassium chloride (KLOR-CON M) 10 MEQ tablet Take 10 mEq by mouth daily.   pravastatin (PRAVACHOL) 40 MG tablet Take 40 mg by mouth daily.   Rivaroxaban (XARELTO) 15 MG TABS tablet Take 1 tablet (15 mg  total) by mouth daily with supper.   tamsulosin (FLOMAX) 0.4 MG CAPS capsule Take 0.4 mg by mouth daily.   Vitamin D, Ergocalciferol, (DRISDOL) 1.25 MG (50000 UNIT) CAPS capsule Take 50,000 Units by mouth once a week.     Allergies:   Patient has no known allergies.   Social History   Socioeconomic History   Marital status: Married    Spouse name: Not on file   Number of children: Not on file   Years of education: Not on file   Highest education level: Not on file  Occupational History   Not on file  Tobacco Use   Smoking status: Never   Smokeless tobacco: Never  Substance and Sexual Activity   Alcohol use: Not on file   Drug use: Not on file   Sexual activity: Not on file  Other Topics Concern   Not on file  Social History Narrative   Not on file   Social Determinants of Health   Financial Resource Strain: Not on file  Food Insecurity: Not on file  Transportation Needs: Not on file  Physical Activity: Not on file  Stress: Not on file  Social Connections: Not on file     Family History: The patient's family history includes Cancer in his father; Hypertension in his mother.  ROS:   Please see the history of present illness.    All other systems reviewed and are negative.  EKGs/Labs/Other Studies Reviewed:    The following studies were reviewed today: I discussed my findings with the patient at length.   Recent Labs: No results found for requested labs within last 365 days.  Recent Lipid Panel    Component Value Date/Time   CHOL 139 04/12/2021 1159   TRIG 163 (H) 04/12/2021 1159   HDL 30 (L) 04/12/2021 1159   CHOLHDL 4.6 04/12/2021 1159   LDLCALC 81 04/12/2021 1159    Physical Exam:    VS:  BP (!) 140/84   Pulse 62   Ht 5\' 7"  (1.702 m)   Wt 157 lb 3.2 oz (71.3 kg)   SpO2 98%   BMI 24.62 kg/m     Wt Readings from Last 3 Encounters:  07/06/22 157 lb 3.2 oz (71.3 kg)  11/11/21 151 lb 12.8 oz (68.9 kg)  04/12/21 163 lb 12.8 oz (74.3 kg)      GEN: Patient is in no acute distress HEENT: Normal NECK: No JVD; No carotid bruits LYMPHATICS: No lymphadenopathy CARDIAC: Hear sounds regular, 2/6 systolic murmur at the apex. RESPIRATORY:  Clear to auscultation without rales, wheezing or rhonchi  ABDOMEN: Soft, non-tender, non-distended MUSCULOSKELETAL:  No edema; No deformity  SKIN: Warm and dry NEUROLOGIC:  Alert and oriented x 3 PSYCHIATRIC:  Normal affect   Signed, Garwin Brothers, MD  07/06/2022 11:16 AM    [facility]

## 2022-07-07 DIAGNOSIS — C4442 Squamous cell carcinoma of skin of scalp and neck: Secondary | ICD-10-CM | POA: Diagnosis not present

## 2022-07-19 DIAGNOSIS — C44329 Squamous cell carcinoma of skin of other parts of face: Secondary | ICD-10-CM | POA: Diagnosis not present

## 2022-08-02 DIAGNOSIS — Z139 Encounter for screening, unspecified: Secondary | ICD-10-CM | POA: Diagnosis not present

## 2022-08-02 DIAGNOSIS — M179 Osteoarthritis of knee, unspecified: Secondary | ICD-10-CM | POA: Diagnosis not present

## 2022-08-02 DIAGNOSIS — D649 Anemia, unspecified: Secondary | ICD-10-CM | POA: Diagnosis not present

## 2022-08-02 DIAGNOSIS — Z79899 Other long term (current) drug therapy: Secondary | ICD-10-CM | POA: Diagnosis not present

## 2022-08-02 DIAGNOSIS — I10 Essential (primary) hypertension: Secondary | ICD-10-CM | POA: Diagnosis not present

## 2022-08-02 DIAGNOSIS — Z9181 History of falling: Secondary | ICD-10-CM | POA: Diagnosis not present

## 2022-08-02 DIAGNOSIS — E559 Vitamin D deficiency, unspecified: Secondary | ICD-10-CM | POA: Diagnosis not present

## 2022-08-02 DIAGNOSIS — J309 Allergic rhinitis, unspecified: Secondary | ICD-10-CM | POA: Diagnosis not present

## 2022-08-02 DIAGNOSIS — E039 Hypothyroidism, unspecified: Secondary | ICD-10-CM | POA: Diagnosis not present

## 2022-08-02 DIAGNOSIS — I4891 Unspecified atrial fibrillation: Secondary | ICD-10-CM | POA: Diagnosis not present

## 2022-08-02 DIAGNOSIS — R739 Hyperglycemia, unspecified: Secondary | ICD-10-CM | POA: Diagnosis not present

## 2022-08-02 DIAGNOSIS — M1A00X1 Idiopathic chronic gout, unspecified site, with tophus (tophi): Secondary | ICD-10-CM | POA: Diagnosis not present

## 2022-08-02 DIAGNOSIS — N289 Disorder of kidney and ureter, unspecified: Secondary | ICD-10-CM | POA: Diagnosis not present

## 2022-08-13 ENCOUNTER — Other Ambulatory Visit: Payer: Self-pay | Admitting: Cardiology

## 2022-08-15 NOTE — Telephone Encounter (Signed)
Prescription refill request for Xarelto received.  Indication:afib  Last office visit: Revankar, 07/06/2022 Weight: 71.3 kg  Age: 89 Scr: 1.8, 10/12/2021 CrCl: 44 ml/min   Refill sent.

## 2022-08-30 ENCOUNTER — Other Ambulatory Visit: Payer: Self-pay | Admitting: Cardiology

## 2022-09-05 ENCOUNTER — Other Ambulatory Visit: Payer: Self-pay | Admitting: Cardiology

## 2022-09-28 DIAGNOSIS — Z79899 Other long term (current) drug therapy: Secondary | ICD-10-CM | POA: Diagnosis not present

## 2022-09-28 DIAGNOSIS — M1A9XX1 Chronic gout, unspecified, with tophus (tophi): Secondary | ICD-10-CM | POA: Diagnosis not present

## 2022-09-28 DIAGNOSIS — N184 Chronic kidney disease, stage 4 (severe): Secondary | ICD-10-CM | POA: Diagnosis not present

## 2022-09-28 DIAGNOSIS — M109 Gout, unspecified: Secondary | ICD-10-CM | POA: Diagnosis not present

## 2022-09-28 DIAGNOSIS — M7989 Other specified soft tissue disorders: Secondary | ICD-10-CM | POA: Diagnosis not present

## 2022-10-04 DIAGNOSIS — G8929 Other chronic pain: Secondary | ICD-10-CM | POA: Diagnosis not present

## 2022-10-04 DIAGNOSIS — M1711 Unilateral primary osteoarthritis, right knee: Secondary | ICD-10-CM | POA: Diagnosis not present

## 2022-10-04 DIAGNOSIS — M25561 Pain in right knee: Secondary | ICD-10-CM | POA: Diagnosis not present

## 2022-10-15 ENCOUNTER — Other Ambulatory Visit: Payer: Self-pay | Admitting: Cardiology

## 2022-10-15 DIAGNOSIS — I4821 Permanent atrial fibrillation: Secondary | ICD-10-CM

## 2022-10-17 NOTE — Telephone Encounter (Signed)
Xarelto 15mg  refill request received. Pt is 89 years old, weight-71.3kg, Crea-2.26 on 09/28/22 via [employer] tab from [facility]; last seen by Dr. Tomie China on 07/06/22, Diagnosis-Afib, CrCl-23.22 mL/min; Dose is appropriate based on dosing criteria. Will send in refill to requested pharmacy.

## 2022-11-01 ENCOUNTER — Other Ambulatory Visit: Payer: Self-pay | Admitting: Cardiology

## 2022-11-02 DIAGNOSIS — R739 Hyperglycemia, unspecified: Secondary | ICD-10-CM | POA: Diagnosis not present

## 2022-11-02 DIAGNOSIS — D649 Anemia, unspecified: Secondary | ICD-10-CM | POA: Diagnosis not present

## 2022-11-02 DIAGNOSIS — I4891 Unspecified atrial fibrillation: Secondary | ICD-10-CM | POA: Diagnosis not present

## 2022-11-02 DIAGNOSIS — I10 Essential (primary) hypertension: Secondary | ICD-10-CM | POA: Diagnosis not present

## 2022-11-02 DIAGNOSIS — R600 Localized edema: Secondary | ICD-10-CM | POA: Diagnosis not present

## 2022-11-02 DIAGNOSIS — E039 Hypothyroidism, unspecified: Secondary | ICD-10-CM | POA: Diagnosis not present

## 2022-11-02 DIAGNOSIS — N289 Disorder of kidney and ureter, unspecified: Secondary | ICD-10-CM | POA: Diagnosis not present

## 2022-11-08 DIAGNOSIS — M1711 Unilateral primary osteoarthritis, right knee: Secondary | ICD-10-CM | POA: Diagnosis not present

## 2022-11-10 DIAGNOSIS — C4442 Squamous cell carcinoma of skin of scalp and neck: Secondary | ICD-10-CM | POA: Diagnosis not present

## 2022-11-10 DIAGNOSIS — L570 Actinic keratosis: Secondary | ICD-10-CM | POA: Diagnosis not present

## 2022-11-10 DIAGNOSIS — C44329 Squamous cell carcinoma of skin of other parts of face: Secondary | ICD-10-CM | POA: Diagnosis not present

## 2022-11-10 DIAGNOSIS — D0462 Carcinoma in situ of skin of left upper limb, including shoulder: Secondary | ICD-10-CM | POA: Diagnosis not present

## 2022-11-10 DIAGNOSIS — D044 Carcinoma in situ of skin of scalp and neck: Secondary | ICD-10-CM | POA: Diagnosis not present

## 2022-11-10 DIAGNOSIS — L821 Other seborrheic keratosis: Secondary | ICD-10-CM | POA: Diagnosis not present

## 2022-11-10 DIAGNOSIS — L578 Other skin changes due to chronic exposure to nonionizing radiation: Secondary | ICD-10-CM | POA: Diagnosis not present

## 2022-11-15 DIAGNOSIS — M1711 Unilateral primary osteoarthritis, right knee: Secondary | ICD-10-CM | POA: Diagnosis not present

## 2022-11-22 DIAGNOSIS — C4442 Squamous cell carcinoma of skin of scalp and neck: Secondary | ICD-10-CM | POA: Diagnosis not present

## 2022-11-22 DIAGNOSIS — M1711 Unilateral primary osteoarthritis, right knee: Secondary | ICD-10-CM | POA: Diagnosis not present

## 2023-01-01 DIAGNOSIS — I951 Orthostatic hypotension: Secondary | ICD-10-CM | POA: Diagnosis not present

## 2023-01-01 DIAGNOSIS — I959 Hypotension, unspecified: Secondary | ICD-10-CM | POA: Diagnosis not present

## 2023-01-01 DIAGNOSIS — I252 Old myocardial infarction: Secondary | ICD-10-CM | POA: Diagnosis not present

## 2023-01-01 DIAGNOSIS — Z950 Presence of cardiac pacemaker: Secondary | ICD-10-CM | POA: Diagnosis not present

## 2023-01-01 DIAGNOSIS — E7800 Pure hypercholesterolemia, unspecified: Secondary | ICD-10-CM | POA: Diagnosis not present

## 2023-01-01 DIAGNOSIS — Z7901 Long term (current) use of anticoagulants: Secondary | ICD-10-CM | POA: Diagnosis not present

## 2023-01-01 DIAGNOSIS — R0902 Hypoxemia: Secondary | ICD-10-CM | POA: Diagnosis not present

## 2023-01-01 DIAGNOSIS — R9431 Abnormal electrocardiogram [ECG] [EKG]: Secondary | ICD-10-CM | POA: Diagnosis not present

## 2023-01-01 DIAGNOSIS — K219 Gastro-esophageal reflux disease without esophagitis: Secondary | ICD-10-CM | POA: Diagnosis not present

## 2023-01-01 DIAGNOSIS — R531 Weakness: Secondary | ICD-10-CM | POA: Diagnosis not present

## 2023-01-01 DIAGNOSIS — E860 Dehydration: Secondary | ICD-10-CM | POA: Diagnosis not present

## 2023-01-01 DIAGNOSIS — N179 Acute kidney failure, unspecified: Secondary | ICD-10-CM | POA: Diagnosis not present

## 2023-01-01 DIAGNOSIS — I2510 Atherosclerotic heart disease of native coronary artery without angina pectoris: Secondary | ICD-10-CM | POA: Diagnosis not present

## 2023-01-01 DIAGNOSIS — I10 Essential (primary) hypertension: Secondary | ICD-10-CM | POA: Diagnosis not present

## 2023-01-01 DIAGNOSIS — Z85828 Personal history of other malignant neoplasm of skin: Secondary | ICD-10-CM | POA: Diagnosis not present

## 2023-01-01 DIAGNOSIS — R739 Hyperglycemia, unspecified: Secondary | ICD-10-CM | POA: Diagnosis not present

## 2023-01-01 DIAGNOSIS — N189 Chronic kidney disease, unspecified: Secondary | ICD-10-CM | POA: Diagnosis not present

## 2023-01-01 DIAGNOSIS — Z1152 Encounter for screening for COVID-19: Secondary | ICD-10-CM | POA: Diagnosis not present

## 2023-01-01 DIAGNOSIS — M1990 Unspecified osteoarthritis, unspecified site: Secondary | ICD-10-CM | POA: Diagnosis not present

## 2023-01-01 DIAGNOSIS — I4891 Unspecified atrial fibrillation: Secondary | ICD-10-CM | POA: Diagnosis not present

## 2023-01-01 DIAGNOSIS — I700 Atherosclerosis of aorta: Secondary | ICD-10-CM | POA: Diagnosis not present

## 2023-01-01 DIAGNOSIS — I129 Hypertensive chronic kidney disease with stage 1 through stage 4 chronic kidney disease, or unspecified chronic kidney disease: Secondary | ICD-10-CM | POA: Diagnosis not present

## 2023-01-01 DIAGNOSIS — E039 Hypothyroidism, unspecified: Secondary | ICD-10-CM | POA: Diagnosis not present

## 2023-01-01 DIAGNOSIS — Z79899 Other long term (current) drug therapy: Secondary | ICD-10-CM | POA: Diagnosis not present

## 2023-01-01 DIAGNOSIS — Z951 Presence of aortocoronary bypass graft: Secondary | ICD-10-CM | POA: Diagnosis not present

## 2023-01-01 DIAGNOSIS — R0989 Other specified symptoms and signs involving the circulatory and respiratory systems: Secondary | ICD-10-CM | POA: Diagnosis not present

## 2023-01-01 DIAGNOSIS — Z8673 Personal history of transient ischemic attack (TIA), and cerebral infarction without residual deficits: Secondary | ICD-10-CM | POA: Diagnosis not present

## 2023-01-02 DIAGNOSIS — I959 Hypotension, unspecified: Secondary | ICD-10-CM | POA: Diagnosis not present

## 2023-01-02 DIAGNOSIS — N179 Acute kidney failure, unspecified: Secondary | ICD-10-CM | POA: Diagnosis not present

## 2023-01-02 DIAGNOSIS — R0902 Hypoxemia: Secondary | ICD-10-CM | POA: Diagnosis not present

## 2023-01-02 DIAGNOSIS — I10 Essential (primary) hypertension: Secondary | ICD-10-CM | POA: Diagnosis not present

## 2023-01-02 DIAGNOSIS — I517 Cardiomegaly: Secondary | ICD-10-CM | POA: Diagnosis not present

## 2023-01-02 DIAGNOSIS — I4891 Unspecified atrial fibrillation: Secondary | ICD-10-CM | POA: Diagnosis not present

## 2023-01-03 DIAGNOSIS — N179 Acute kidney failure, unspecified: Secondary | ICD-10-CM | POA: Diagnosis not present

## 2023-01-03 DIAGNOSIS — R0902 Hypoxemia: Secondary | ICD-10-CM | POA: Diagnosis not present

## 2023-01-03 DIAGNOSIS — I959 Hypotension, unspecified: Secondary | ICD-10-CM | POA: Diagnosis not present

## 2023-01-03 DIAGNOSIS — I4891 Unspecified atrial fibrillation: Secondary | ICD-10-CM | POA: Diagnosis not present

## 2023-01-03 DIAGNOSIS — I10 Essential (primary) hypertension: Secondary | ICD-10-CM | POA: Diagnosis not present

## 2023-01-04 DIAGNOSIS — I4891 Unspecified atrial fibrillation: Secondary | ICD-10-CM | POA: Diagnosis not present

## 2023-01-04 DIAGNOSIS — N179 Acute kidney failure, unspecified: Secondary | ICD-10-CM | POA: Diagnosis not present

## 2023-01-04 DIAGNOSIS — R0902 Hypoxemia: Secondary | ICD-10-CM | POA: Diagnosis not present

## 2023-01-04 DIAGNOSIS — I959 Hypotension, unspecified: Secondary | ICD-10-CM | POA: Diagnosis not present

## 2023-01-04 DIAGNOSIS — I10 Essential (primary) hypertension: Secondary | ICD-10-CM | POA: Diagnosis not present

## 2023-01-08 DIAGNOSIS — I4891 Unspecified atrial fibrillation: Secondary | ICD-10-CM | POA: Diagnosis not present

## 2023-01-08 DIAGNOSIS — I951 Orthostatic hypotension: Secondary | ICD-10-CM | POA: Diagnosis not present

## 2023-01-08 DIAGNOSIS — I499 Cardiac arrhythmia, unspecified: Secondary | ICD-10-CM | POA: Diagnosis not present

## 2023-01-08 DIAGNOSIS — R9431 Abnormal electrocardiogram [ECG] [EKG]: Secondary | ICD-10-CM | POA: Diagnosis not present

## 2023-01-08 DIAGNOSIS — R918 Other nonspecific abnormal finding of lung field: Secondary | ICD-10-CM | POA: Diagnosis not present

## 2023-01-08 DIAGNOSIS — R531 Weakness: Secondary | ICD-10-CM | POA: Diagnosis not present

## 2023-01-08 DIAGNOSIS — J9601 Acute respiratory failure with hypoxia: Secondary | ICD-10-CM | POA: Diagnosis not present

## 2023-01-08 DIAGNOSIS — R0989 Other specified symptoms and signs involving the circulatory and respiratory systems: Secondary | ICD-10-CM | POA: Diagnosis not present

## 2023-01-08 DIAGNOSIS — J90 Pleural effusion, not elsewhere classified: Secondary | ICD-10-CM | POA: Diagnosis not present

## 2023-01-08 DIAGNOSIS — Z743 Need for continuous supervision: Secondary | ICD-10-CM | POA: Diagnosis not present

## 2023-01-08 DIAGNOSIS — I700 Atherosclerosis of aorta: Secondary | ICD-10-CM | POA: Diagnosis not present

## 2023-01-08 DIAGNOSIS — I639 Cerebral infarction, unspecified: Secondary | ICD-10-CM | POA: Diagnosis not present

## 2023-01-08 DIAGNOSIS — I959 Hypotension, unspecified: Secondary | ICD-10-CM | POA: Diagnosis not present

## 2023-01-08 DIAGNOSIS — G9341 Metabolic encephalopathy: Secondary | ICD-10-CM | POA: Diagnosis not present

## 2023-01-08 DIAGNOSIS — I10 Essential (primary) hypertension: Secondary | ICD-10-CM | POA: Diagnosis not present

## 2023-01-09 DIAGNOSIS — J90 Pleural effusion, not elsewhere classified: Secondary | ICD-10-CM | POA: Diagnosis not present

## 2023-01-09 DIAGNOSIS — I10 Essential (primary) hypertension: Secondary | ICD-10-CM | POA: Diagnosis not present

## 2023-01-09 DIAGNOSIS — R531 Weakness: Secondary | ICD-10-CM | POA: Diagnosis not present

## 2023-01-09 DIAGNOSIS — G9341 Metabolic encephalopathy: Secondary | ICD-10-CM | POA: Diagnosis not present

## 2023-01-09 DIAGNOSIS — J9601 Acute respiratory failure with hypoxia: Secondary | ICD-10-CM | POA: Diagnosis not present

## 2023-01-09 DIAGNOSIS — I951 Orthostatic hypotension: Secondary | ICD-10-CM | POA: Diagnosis not present

## 2023-01-10 DIAGNOSIS — E039 Hypothyroidism, unspecified: Secondary | ICD-10-CM | POA: Diagnosis not present

## 2023-01-10 DIAGNOSIS — R0902 Hypoxemia: Secondary | ICD-10-CM | POA: Diagnosis not present

## 2023-01-10 DIAGNOSIS — Z23 Encounter for immunization: Secondary | ICD-10-CM | POA: Diagnosis not present

## 2023-01-10 DIAGNOSIS — Z7952 Long term (current) use of systemic steroids: Secondary | ICD-10-CM | POA: Diagnosis not present

## 2023-01-10 DIAGNOSIS — J440 Chronic obstructive pulmonary disease with acute lower respiratory infection: Secondary | ICD-10-CM | POA: Diagnosis not present

## 2023-01-10 DIAGNOSIS — I951 Orthostatic hypotension: Secondary | ICD-10-CM | POA: Diagnosis not present

## 2023-01-10 DIAGNOSIS — R531 Weakness: Secondary | ICD-10-CM | POA: Diagnosis not present

## 2023-01-10 DIAGNOSIS — J441 Chronic obstructive pulmonary disease with (acute) exacerbation: Secondary | ICD-10-CM | POA: Diagnosis not present

## 2023-01-10 DIAGNOSIS — Z8673 Personal history of transient ischemic attack (TIA), and cerebral infarction without residual deficits: Secondary | ICD-10-CM | POA: Diagnosis not present

## 2023-01-10 DIAGNOSIS — I4891 Unspecified atrial fibrillation: Secondary | ICD-10-CM | POA: Diagnosis not present

## 2023-01-10 DIAGNOSIS — E860 Dehydration: Secondary | ICD-10-CM | POA: Diagnosis not present

## 2023-01-10 DIAGNOSIS — I5032 Chronic diastolic (congestive) heart failure: Secondary | ICD-10-CM | POA: Diagnosis not present

## 2023-01-10 DIAGNOSIS — E861 Hypovolemia: Secondary | ICD-10-CM | POA: Diagnosis not present

## 2023-01-10 DIAGNOSIS — Z792 Long term (current) use of antibiotics: Secondary | ICD-10-CM | POA: Diagnosis not present

## 2023-01-10 DIAGNOSIS — J984 Other disorders of lung: Secondary | ICD-10-CM | POA: Diagnosis not present

## 2023-01-10 DIAGNOSIS — J9601 Acute respiratory failure with hypoxia: Secondary | ICD-10-CM | POA: Diagnosis not present

## 2023-01-10 DIAGNOSIS — J90 Pleural effusion, not elsewhere classified: Secondary | ICD-10-CM | POA: Diagnosis not present

## 2023-01-10 DIAGNOSIS — E7800 Pure hypercholesterolemia, unspecified: Secondary | ICD-10-CM | POA: Diagnosis not present

## 2023-01-10 DIAGNOSIS — Z7901 Long term (current) use of anticoagulants: Secondary | ICD-10-CM | POA: Diagnosis not present

## 2023-01-10 DIAGNOSIS — I2510 Atherosclerotic heart disease of native coronary artery without angina pectoris: Secondary | ICD-10-CM | POA: Diagnosis not present

## 2023-01-10 DIAGNOSIS — E876 Hypokalemia: Secondary | ICD-10-CM | POA: Diagnosis not present

## 2023-01-10 DIAGNOSIS — I517 Cardiomegaly: Secondary | ICD-10-CM | POA: Diagnosis not present

## 2023-01-10 DIAGNOSIS — Z79899 Other long term (current) drug therapy: Secondary | ICD-10-CM | POA: Diagnosis not present

## 2023-01-10 DIAGNOSIS — Z66 Do not resuscitate: Secondary | ICD-10-CM | POA: Diagnosis not present

## 2023-01-10 DIAGNOSIS — G9341 Metabolic encephalopathy: Secondary | ICD-10-CM | POA: Diagnosis not present

## 2023-01-10 DIAGNOSIS — J9602 Acute respiratory failure with hypercapnia: Secondary | ICD-10-CM | POA: Diagnosis not present

## 2023-01-10 DIAGNOSIS — K219 Gastro-esophageal reflux disease without esophagitis: Secondary | ICD-10-CM | POA: Diagnosis not present

## 2023-01-10 DIAGNOSIS — Z951 Presence of aortocoronary bypass graft: Secondary | ICD-10-CM | POA: Diagnosis not present

## 2023-01-10 DIAGNOSIS — R0989 Other specified symptoms and signs involving the circulatory and respiratory systems: Secondary | ICD-10-CM | POA: Diagnosis not present

## 2023-01-13 ENCOUNTER — Other Ambulatory Visit: Payer: Self-pay | Admitting: Cardiology

## 2023-01-15 DIAGNOSIS — R9431 Abnormal electrocardiogram [ECG] [EKG]: Secondary | ICD-10-CM | POA: Diagnosis not present

## 2023-01-15 DIAGNOSIS — I509 Heart failure, unspecified: Secondary | ICD-10-CM | POA: Diagnosis not present

## 2023-01-15 DIAGNOSIS — T68XXXA Hypothermia, initial encounter: Secondary | ICD-10-CM | POA: Diagnosis not present

## 2023-01-15 DIAGNOSIS — E039 Hypothyroidism, unspecified: Secondary | ICD-10-CM | POA: Diagnosis not present

## 2023-01-15 DIAGNOSIS — R609 Edema, unspecified: Secondary | ICD-10-CM | POA: Diagnosis not present

## 2023-01-15 DIAGNOSIS — R0989 Other specified symptoms and signs involving the circulatory and respiratory systems: Secondary | ICD-10-CM | POA: Diagnosis not present

## 2023-01-15 DIAGNOSIS — A419 Sepsis, unspecified organism: Secondary | ICD-10-CM | POA: Diagnosis not present

## 2023-01-15 DIAGNOSIS — Z8673 Personal history of transient ischemic attack (TIA), and cerebral infarction without residual deficits: Secondary | ICD-10-CM | POA: Diagnosis not present

## 2023-01-15 DIAGNOSIS — Z951 Presence of aortocoronary bypass graft: Secondary | ICD-10-CM | POA: Diagnosis not present

## 2023-01-15 DIAGNOSIS — E875 Hyperkalemia: Secondary | ICD-10-CM | POA: Diagnosis not present

## 2023-01-15 DIAGNOSIS — Z1152 Encounter for screening for COVID-19: Secondary | ICD-10-CM | POA: Diagnosis not present

## 2023-01-15 DIAGNOSIS — N179 Acute kidney failure, unspecified: Secondary | ICD-10-CM | POA: Diagnosis not present

## 2023-01-15 DIAGNOSIS — E860 Dehydration: Secondary | ICD-10-CM | POA: Diagnosis not present

## 2023-01-15 DIAGNOSIS — Z7901 Long term (current) use of anticoagulants: Secondary | ICD-10-CM | POA: Diagnosis not present

## 2023-01-15 DIAGNOSIS — Z79899 Other long term (current) drug therapy: Secondary | ICD-10-CM | POA: Diagnosis not present

## 2023-01-15 DIAGNOSIS — J90 Pleural effusion, not elsewhere classified: Secondary | ICD-10-CM | POA: Diagnosis not present

## 2023-01-15 DIAGNOSIS — I10 Essential (primary) hypertension: Secondary | ICD-10-CM | POA: Diagnosis not present

## 2023-01-15 DIAGNOSIS — M109 Gout, unspecified: Secondary | ICD-10-CM | POA: Diagnosis not present

## 2023-01-15 DIAGNOSIS — Z85828 Personal history of other malignant neoplasm of skin: Secondary | ICD-10-CM | POA: Diagnosis not present

## 2023-01-15 DIAGNOSIS — I4891 Unspecified atrial fibrillation: Secondary | ICD-10-CM | POA: Diagnosis not present

## 2023-01-15 DIAGNOSIS — R918 Other nonspecific abnormal finding of lung field: Secondary | ICD-10-CM | POA: Diagnosis not present

## 2023-01-15 DIAGNOSIS — K219 Gastro-esophageal reflux disease without esophagitis: Secondary | ICD-10-CM | POA: Diagnosis not present

## 2023-01-15 DIAGNOSIS — I959 Hypotension, unspecified: Secondary | ICD-10-CM | POA: Diagnosis not present

## 2023-01-15 DIAGNOSIS — Z743 Need for continuous supervision: Secondary | ICD-10-CM | POA: Diagnosis not present

## 2023-01-15 DIAGNOSIS — I252 Old myocardial infarction: Secondary | ICD-10-CM | POA: Diagnosis not present

## 2023-01-15 DIAGNOSIS — G9341 Metabolic encephalopathy: Secondary | ICD-10-CM | POA: Diagnosis not present

## 2023-01-15 DIAGNOSIS — I2510 Atherosclerotic heart disease of native coronary artery without angina pectoris: Secondary | ICD-10-CM | POA: Diagnosis not present

## 2023-01-15 DIAGNOSIS — R5381 Other malaise: Secondary | ICD-10-CM | POA: Diagnosis not present

## 2023-01-15 DIAGNOSIS — I5033 Acute on chronic diastolic (congestive) heart failure: Secondary | ICD-10-CM | POA: Diagnosis not present

## 2023-01-15 DIAGNOSIS — I499 Cardiac arrhythmia, unspecified: Secondary | ICD-10-CM | POA: Diagnosis not present

## 2023-01-15 DIAGNOSIS — I130 Hypertensive heart and chronic kidney disease with heart failure and stage 1 through stage 4 chronic kidney disease, or unspecified chronic kidney disease: Secondary | ICD-10-CM | POA: Diagnosis not present

## 2023-01-15 DIAGNOSIS — N189 Chronic kidney disease, unspecified: Secondary | ICD-10-CM | POA: Diagnosis not present

## 2023-01-15 DIAGNOSIS — M1990 Unspecified osteoarthritis, unspecified site: Secondary | ICD-10-CM | POA: Diagnosis not present

## 2023-01-15 DIAGNOSIS — R531 Weakness: Secondary | ICD-10-CM | POA: Diagnosis not present

## 2023-01-15 DIAGNOSIS — E7800 Pure hypercholesterolemia, unspecified: Secondary | ICD-10-CM | POA: Diagnosis not present

## 2023-01-27 DEATH — deceased

## 2023-01-31 ENCOUNTER — Encounter: Payer: Self-pay | Admitting: Internal Medicine
